# Patient Record
Sex: Female | Born: 1950 | Race: White | Hispanic: No | Smoking: Never smoker
Health system: Southern US, Community
[De-identification: ages and names within clinical notes are randomized; demographics above are authoritative.]

## PROBLEM LIST (undated history)

## (undated) ENCOUNTER — Ambulatory Visit: Admission: EM

## (undated) DIAGNOSIS — E785 Hyperlipidemia, unspecified: Secondary | ICD-10-CM

## (undated) DIAGNOSIS — I351 Nonrheumatic aortic (valve) insufficiency: Secondary | ICD-10-CM

## (undated) DIAGNOSIS — E032 Hypothyroidism due to medicaments and other exogenous substances: Secondary | ICD-10-CM

## (undated) DIAGNOSIS — C8510 Unspecified B-cell lymphoma, unspecified site: Secondary | ICD-10-CM

## (undated) DIAGNOSIS — T7840XA Allergy, unspecified, initial encounter: Secondary | ICD-10-CM

## (undated) DIAGNOSIS — C801 Malignant (primary) neoplasm, unspecified: Secondary | ICD-10-CM

## (undated) DIAGNOSIS — H269 Unspecified cataract: Secondary | ICD-10-CM

## (undated) DIAGNOSIS — E079 Disorder of thyroid, unspecified: Secondary | ICD-10-CM

## (undated) DIAGNOSIS — Q963 Mosaicism, 45, X/46, XX or XY: Secondary | ICD-10-CM

## (undated) DIAGNOSIS — E039 Hypothyroidism, unspecified: Secondary | ICD-10-CM

## (undated) DIAGNOSIS — M810 Age-related osteoporosis without current pathological fracture: Secondary | ICD-10-CM

## (undated) DIAGNOSIS — I071 Rheumatic tricuspid insufficiency: Secondary | ICD-10-CM

## (undated) DIAGNOSIS — H9190 Unspecified hearing loss, unspecified ear: Secondary | ICD-10-CM

## (undated) DIAGNOSIS — N390 Urinary tract infection, site not specified: Secondary | ICD-10-CM

## (undated) DIAGNOSIS — Z8589 Personal history of malignant neoplasm of other organs and systems: Secondary | ICD-10-CM

## (undated) HISTORY — DX: Unspecified B-cell lymphoma, unspecified site: C85.10

## (undated) HISTORY — DX: Age-related osteoporosis without current pathological fracture: M81.0

## (undated) HISTORY — PX: HERNIA REPAIR: SHX51

## (undated) HISTORY — PX: APPENDECTOMY: SHX54

## (undated) HISTORY — DX: Malignant (primary) neoplasm, unspecified: C80.1

## (undated) HISTORY — DX: Hyperlipidemia, unspecified: E78.5

## (undated) HISTORY — DX: Rheumatic tricuspid insufficiency: I07.1

## (undated) HISTORY — DX: Unspecified cataract: H26.9

## (undated) HISTORY — DX: Hypothyroidism due to medicaments and other exogenous substances: E03.2

## (undated) HISTORY — DX: Disorder of thyroid, unspecified: E07.9

## (undated) HISTORY — DX: Allergy, unspecified, initial encounter: T78.40XA

## (undated) HISTORY — PX: EYE SURGERY: SHX253

## (undated) HISTORY — DX: Hypothyroidism, unspecified: E03.9

## (undated) HISTORY — DX: Personal history of malignant neoplasm of other organs and systems: Z85.89

## (undated) HISTORY — DX: Mosaicism, 45, X/46, XX or XY: Q96.3

## (undated) HISTORY — DX: Unspecified hearing loss, unspecified ear: H91.90

## (undated) HISTORY — DX: Nonrheumatic aortic (valve) insufficiency: I35.1

## (undated) HISTORY — PX: ABDOMINAL HYSTERECTOMY: SHX81

---

## 2011-01-24 ENCOUNTER — Inpatient Hospital Stay (INDEPENDENT_AMBULATORY_CARE_PROVIDER_SITE_OTHER)
Admission: RE | Admit: 2011-01-24 | Discharge: 2011-01-24 | Disposition: A | Discharge status: Home / Self Care | Source: Ambulatory Visit | Attending: Family Medicine | Admitting: Family Medicine

## 2011-01-24 DIAGNOSIS — N390 Urinary tract infection, site not specified: Secondary | ICD-10-CM

## 2011-10-26 ENCOUNTER — Ambulatory Visit (INDEPENDENT_AMBULATORY_CARE_PROVIDER_SITE_OTHER): Admitting: Family Medicine

## 2011-10-26 VITALS — BP 108/66 | HR 60 | Temp 98.1°F | Resp 16 | Ht <= 58 in | Wt 101.0 lb

## 2011-10-26 DIAGNOSIS — N390 Urinary tract infection, site not specified: Secondary | ICD-10-CM

## 2011-10-26 DIAGNOSIS — R350 Frequency of micturition: Secondary | ICD-10-CM

## 2011-10-26 LAB — POCT URINALYSIS DIPSTICK
Bilirubin, UA: NEGATIVE
Blood, UA: NEGATIVE
Glucose, UA: NEGATIVE
Ketones, UA: NEGATIVE
Nitrite, UA: NEGATIVE
Protein, UA: NEGATIVE
Spec Grav, UA: 1.01
Urobilinogen, UA: 0.2
pH, UA: 7

## 2011-10-26 LAB — POCT UA - MICROSCOPIC ONLY
Casts, Ur, LPF, POC: NEGATIVE
Crystals, Ur, HPF, POC: NEGATIVE
Mucus, UA: NEGATIVE
Yeast, UA: NEGATIVE

## 2011-10-26 MED ORDER — CIPROFLOXACIN HCL 250 MG PO TABS: 250.0000 mg | ORAL_TABLET | Freq: Two times a day (BID) | ORAL | Status: AC

## 2011-10-26 NOTE — Patient Instructions (Signed)

## 2011-10-26 NOTE — Progress Notes (Signed)
S:  61 yo woman with 3 weeks of dysuria and frequency.  The symptoms are mild  No fever, back pain, ;hematuria  O:  Alert, NAD No cvat  Results for orders placed in visit on 10/26/11  POCT URINALYSIS DIPSTICK      Component Value Range   Color, UA yellow     Clarity, UA clear     Glucose, UA neg     Bilirubin, UA neg     Ketones, UA neg     Spec Grav, UA 1.010     Blood, UA neg     pH, UA 7.0     Protein, UA neg     Urobilinogen, UA 0.2     Nitrite, UA neg     Leukocytes, UA small (1+)     A: uncomplicated UTI  P:   1. Urinary frequency  POCT urinalysis dipstick, POCT UA - Microscopic Only, Urine culture, ciprofloxacin (CIPRO) 250 MG tablet

## 2011-10-28 LAB — URINE CULTURE
Colony Count: NO GROWTH
Organism ID, Bacteria: NO GROWTH

## 2013-10-07 ENCOUNTER — Ambulatory Visit (INDEPENDENT_AMBULATORY_CARE_PROVIDER_SITE_OTHER): Admitting: Physician Assistant

## 2013-10-07 VITALS — BP 98/66 | HR 79 | Temp 98.8°F | Resp 16 | Ht 59.0 in | Wt 107.6 lb

## 2013-10-07 DIAGNOSIS — J069 Acute upper respiratory infection, unspecified: Secondary | ICD-10-CM

## 2013-10-07 DIAGNOSIS — J329 Chronic sinusitis, unspecified: Secondary | ICD-10-CM

## 2013-10-07 MED ORDER — AMOXICILLIN-POT CLAVULANATE 875-125 MG PO TABS: 1.0000 | ORAL_TABLET | Freq: Two times a day (BID) | ORAL | Status: DC

## 2013-10-07 MED ORDER — BENZONATATE 100 MG PO CAPS: 100.0000 mg | ORAL_CAPSULE | Freq: Three times a day (TID) | ORAL | Status: DC | PRN

## 2013-10-07 MED ORDER — PROMETHAZINE-DM 6.25-15 MG/5ML PO SYRP: 5.0000 mL | ORAL_SOLUTION | Freq: Four times a day (QID) | ORAL | Status: DC | PRN

## 2013-10-07 NOTE — Progress Notes (Signed)
   Subjective:    Patient ID: Tabitha Baker, female    DOB: Jul 25, 1950, 63 y.o.   MRN: [id number]  HPI   Tabitha Baker is a very pleasant 63 yr old female here with concern for illness.  Reports 4 days of symptoms.  Started with scratchy throat, then sinus HA.  Now with dry, hacking cough.  Feels "puffy" in her sinuses.  She has ome RIGHT ear fullness.  She denies SOB or wheezing.  Subjective fever but none measured.  She is using Nasacort BID, Mucinex and is doing sinus flushes.  She reports a significant ENT history - ENT is in [place] where she lives part time.  She reports she nearly had a tympanostomy due to mid ear effusion.  She had similar symptoms in Feb 2015 - thinks was treated with avelox.  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   Review of Systems  Constitutional: Positive for fever (subjective). Negative for chills.  HENT: Positive for congestion, ear pain, sinus pressure and sore throat.   Respiratory: Positive for cough. Negative for shortness of breath and wheezing.   Cardiovascular: Negative.   Gastrointestinal: Negative.   Musculoskeletal: Positive for myalgias.  Skin: Negative.        Objective:   Physical Exam  Vitals reviewed. Constitutional: She is oriented to person, place, and time. She appears well-developed and well-nourished. No distress.  HENT:  Head: Normocephalic and atraumatic.  Right Ear: Tympanic membrane and ear canal normal.  Left Ear: Tympanic membrane and ear canal normal.  Nose: Right sinus exhibits maxillary sinus tenderness and frontal sinus tenderness. Left sinus exhibits maxillary sinus tenderness and frontal sinus tenderness.  Mouth/Throat: Uvula is midline, oropharynx is clear and moist and mucous membranes are normal.  Eyes: Conjunctivae are normal. No scleral icterus.  Neck: Neck supple.  Cardiovascular: Normal rate, regular rhythm and normal heart sounds.   Pulmonary/Chest: Effort normal and breath sounds normal.  She has no wheezes. She has no rales.  Lymphadenopathy:    She has no cervical adenopathy.  Neurological: She is alert and oriented to person, place, and time.  Skin: Skin is warm and dry.  Psychiatric: She has a normal mood and affect. Her behavior is normal.       Assessment & Plan:  Acute upper respiratory infections of unspecified site - Plan: benzonatate (TESSALON) 100 MG capsule, amoxicillin-clavulanate (AUGMENTIN) 875-125 MG per tablet, promethazine-dextromethorphan (PROMETHAZINE-DM) 6.25-15 MG/5ML syrup  Sinusitis - Plan: amoxicillin-clavulanate (AUGMENTIN) 875-125 MG per tablet   Tabitha Baker is a very pleasant 63 yr old female with 4 days of URI symptoms.  Discussed with pt that is is likely to be viral, but she is very concerned for bacterial infection given her ENT history and her history of lymphoma.  Therefore will treat with augmentin x 10 days.  Continue Nasacort, Mucinex, nasal saline lavage.  Add Tessalon and Phenergan DM for cough.  Push fluids.    Pt to call or RTC if worsening or not improving  E. Natividad Brood MHS, PA-C [facility] 6/8/20157:49 PM

## 2013-10-07 NOTE — Patient Instructions (Signed)
The antibiotic prescribed today is for your present infection only. It is very important to follow the directions for the medication prescribed. Antibiotics are generally given for a specified period of time (7-10 days, for example) to be taken at specific intervals (every 4, 6, 8 or 12 hours). This is necessary to keep the right amount of the medication in the bloodstream. Too much of the medication may cause an adverse reaction, too little may not be completely effective.  To clear your infection completely, continue taking the antibiotic for the full time of treatment, even if you begin to feel better after a few days.  If you miss a dose of the antibiotic, take it as soon as possible. Then go back to your regular dosing schedule. However, don't double up doses.    Begin taking the Augmentin as directed.  Be sure to finish the full course even when you begin to feel better.  Take with food to reduce stomach upset  Continue using Nasacort and Mucinex. Continue sinus flushes  Make sure you are drinking plenty of fluids (water is best!)  Use the benzonatate (Tessalon Perles) every 8 hours as needed for cough  Use the Phenergan DM cough syrup if needed at night - will likely make you sleepy  Please let us know if any symptoms are worsening or not improving

## 2014-02-03 ENCOUNTER — Ambulatory Visit (INDEPENDENT_AMBULATORY_CARE_PROVIDER_SITE_OTHER): Admitting: Family Medicine

## 2014-02-03 VITALS — BP 110/70 | HR 65 | Temp 98.6°F | Resp 16 | Ht 59.0 in | Wt 109.0 lb

## 2014-02-03 DIAGNOSIS — H109 Unspecified conjunctivitis: Secondary | ICD-10-CM

## 2014-02-03 MED ORDER — MOXIFLOXACIN HCL (2X DAY) 0.5 % OP SOLN: 1.0000 [drp] | Freq: Two times a day (BID) | OPHTHALMIC | Status: DC

## 2014-02-03 NOTE — Patient Instructions (Signed)
You do appear to have pinkeye (conjunctivitis).  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  Use the moxeza drops as directed and let me know if you do not feel better in the next 2 days- Sooner if worse.

## 2014-02-03 NOTE — Progress Notes (Signed)
[facility] [address], [place] [address] [phone]  Date:  02/03/2014   Name:  Tabitha Baker   DOB:  October 14, 1950   MRN:  [id number]  PCP:  No primary provider on file.    Chief Complaint: Eye Drainage   History of Present Illness:  Tabitha Baker is a 63 y.o. very pleasant female patient who presents with the following:  Here today with a concern about her left eye.  She noted a problem since yesterday.  She has noted green drainage.  The eye is scratchy and uncomfortable to blink.  She does notice some sensitivity to light.  Her next eye appt is later on this month- her eye doctor is in [place].  She wears glasses for near vision, no contact lenses used  She had cataract surgery in March, and has plugs in her tearducts bilaterally for chronic dry eyes.  She is on restasis, warm compresses and gelteal dry eye gel.   She otherwise feels well and is not aware of any exposure to conjunctivitis.  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    There are no active problems to display for this patient.   Past Medical History  Diagnosis Date  . Allergy   . Cancer   . Cataract   . Osteoporosis   . Thyroid disease     Past Surgical History  Procedure Laterality Date  . Appendectomy    . Eye surgery    . Hernia repair    . Abdominal hysterectomy      History  Substance Use Topics  . Smoking status: Never Smoker   . Smokeless tobacco: Not on file  . Alcohol Use: Not on file    Family History  Problem Relation Age of Onset  . Cancer Mother   . Hyperlipidemia Mother   . Hypertension Mother   . Heart disease Father   . Stroke Father   . Cancer Brother   . Heart disease Brother   . Hypertension Maternal Grandmother   . Stroke Maternal Grandmother   . Cancer Maternal Grandfather   . Heart disease Maternal Grandfather   . Stroke Paternal Grandmother   . Cancer Paternal Grandfather      Allergies  Allergen Reactions  . Sulfa Antibiotics Hives    Medication list has been reviewed and updated.  Current Outpatient Prescriptions on File Prior to Visit  Medication Sig Dispense Refill  . co-enzyme Q-10 30 MG capsule Take 30 mg by mouth 3 (three) times daily.      Marland Kitchen conjugated estrogens (PREMARIN) vaginal cream Place vaginally daily.      Marland Kitchen CRANBERRY EXTRACT PO Take by mouth daily.      . cycloSPORINE (RESTASIS) 0.05 % ophthalmic emulsion Place 1 drop into both eyes 2 (two) times daily.      Marland Kitchen ezetimibe (ZETIA) 10 MG tablet Take 10 mg by mouth daily.      . fish oil-omega-3 fatty acids 1000 MG capsule Take 2 g by mouth daily.      Marland Kitchen levothyroxine (SYNTHROID, LEVOTHROID) 88 MCG tablet Take 88 mcg by mouth daily.      . Red Yeast Rice 600 MG CAPS Take by mouth 4 (four) times daily.      . Risedronate Sodium (ATELVIA) 35 MG TBEC Take 35 mg by mouth once a week.       No current facility-administered medications on file prior to visit.  Review of Systems:  As per HPI- otherwise negative.   Physical Examination: Filed Vitals:   02/03/14 1607  BP: 110/70  Pulse: 65  Temp: 98.6 F (37 C)  Resp: 16   Filed Vitals:   02/03/14 1607  Height: 4\' 11"  (1.499 m)  Weight: 109 lb (49.442 kg)   Body mass index is 22 kg/(m^2). Ideal Body Weight: Weight in (lb) to have BMI = 25: 123.5  GEN: WDWN, NAD, Non-toxic, A & O x 3 HEENT: Atraumatic, Normocephalic. Neck supple. No masses, No LAD.  Bilateral TM wnl, oropharynx normal.  PEERL,EOMI.   Left eye with yellow colored sticky discharge in corners of eye and on lashes b Ears and Nose: No external deformity. CV: RRR, No M/G/R. No JVD. No thrill. No extra heart sounds. PULM: CTA B, no wheezes, crackles, rhonchi. No retractions. No resp. distress. No accessory muscle use. EXTR: No c/c/e NEURO Normal gait.  PSYCH: Normally interactive. Conversant. Not depressed or anxious appearing.  Calm demeanor.  Negative fluorescin  left eye.  She had relief of her sx by proparacaine drops.    Assessment and Plan: Conjunctivitis of left eye - Plan: Moxifloxacin HCl 0.5 % SOLN  Treat for conjunctivitis with moxeza drops, and she will let me know if not better in the next 1-2 days.  Given coupon for moxeza   Signed Lamar Blinks, MD

## 2015-02-11 ENCOUNTER — Ambulatory Visit (INDEPENDENT_AMBULATORY_CARE_PROVIDER_SITE_OTHER): Admitting: Family Medicine

## 2015-02-11 VITALS — BP 110/76 | HR 74 | Temp 98.4°F | Resp 16 | Ht <= 58 in | Wt 111.0 lb

## 2015-02-11 DIAGNOSIS — N3090 Cystitis, unspecified without hematuria: Secondary | ICD-10-CM | POA: Diagnosis not present

## 2015-02-11 DIAGNOSIS — N390 Urinary tract infection, site not specified: Secondary | ICD-10-CM

## 2015-02-11 LAB — POC MICROSCOPIC URINALYSIS (UMFC)

## 2015-02-11 LAB — POCT URINALYSIS DIP (MANUAL ENTRY)
BILIRUBIN UA: NEGATIVE
GLUCOSE UA: NEGATIVE
Ketones, POC UA: NEGATIVE
NITRITE UA: NEGATIVE
PH UA: 6.5
Protein Ur, POC: NEGATIVE
Spec Grav, UA: <=1.005
Urobilinogen, UA: 0.2

## 2015-02-11 MED ORDER — PHENAZOPYRIDINE HCL 200 MG PO TABS: 200.0000 mg | ORAL_TABLET | Freq: Three times a day (TID) | ORAL | Status: DC | PRN

## 2015-02-11 MED ORDER — NITROFURANTOIN MONOHYD MACRO 100 MG PO CAPS: 100.0000 mg | ORAL_CAPSULE | Freq: Two times a day (BID) | ORAL | Status: DC

## 2015-02-11 NOTE — Patient Instructions (Signed)
Try the pyridium for a day or two. If your symptoms get better on it but worse off of it, then you probably do have a UTI and should start on the antibiotic. We will know for sure by this weekend if you need an antibiotic but if your symptoms are getting worse, it is safeest to start it rather than let an infection progress to your kidneys.  Urinary Tract Infection Urinary tract infections (UTIs) can develop anywhere along your urinary tract. Your urinary tract is your body's drainage system for removing wastes and extra water. Your urinary tract includes two kidneys, two ureters, a bladder, and a urethra. Your kidneys are a pair of bean-shaped organs. Each kidney is about the size of your fist. They are located below your ribs, one on each side of your spine. CAUSES Infections are caused by microbes, which are microscopic organisms, including fungi, viruses, and bacteria. These organisms are so small that they can only be seen through a microscope. Bacteria are the microbes that most commonly cause UTIs. SYMPTOMS  Symptoms of UTIs may vary by age and gender of the patient and by the location of the infection. Symptoms in young women typically include a frequent and intense urge to urinate and a painful, burning feeling in the bladder or urethra during urination. Older women and men are more likely to be tired, shaky, and weak and have muscle aches and abdominal pain. A fever may mean the infection is in your kidneys. Other symptoms of a kidney infection include pain in your back or sides below the ribs, nausea, and vomiting. DIAGNOSIS To diagnose a UTI, your caregiver will ask you about your symptoms. Your caregiver will also ask you to provide a urine sample. The urine sample will be tested for bacteria and white blood cells. White blood cells are made by your body to help fight infection. TREATMENT  Typically, UTIs can be treated with medication. Because most UTIs are caused by a bacterial infection,  they usually can be treated with the use of antibiotics. The choice of antibiotic and length of treatment depend on your symptoms and the type of bacteria causing your infection. HOME CARE INSTRUCTIONS  If you were prescribed antibiotics, take them exactly as your caregiver instructs you. Finish the medication even if you feel better after you have only taken some of the medication.  Drink enough water and fluids to keep your urine clear or pale yellow.  Avoid caffeine, tea, and carbonated beverages. They tend to irritate your bladder.  Empty your bladder often. Avoid holding urine for long periods of time.  Empty your bladder before and after sexual intercourse.  After a bowel movement, women should cleanse from front to back. Use each tissue only once. SEEK MEDICAL CARE IF:   You have back pain.  You develop a fever.  Your symptoms do not begin to resolve within 3 days. SEEK IMMEDIATE MEDICAL CARE IF:   You have severe back pain or lower abdominal pain.  You develop chills.  You have nausea or vomiting.  You have continued burning or discomfort with urination. MAKE SURE YOU:   Understand these instructions.  Will watch your condition.  Will get help right away if you are not doing well or get worse.   This information is not intended to replace advice given to you by your health care provider. Make sure you discuss any questions you have with your health care provider.   Document Released: 01/26/2005 Document Revised: 01/07/2015 Document Reviewed:  05/27/2011 Elsevier Interactive Patient Education 2016 Elsevier Inc. Interstitial Cystitis Interstitial cystitis is a condition that causes inflammation of the bladder. The bladder is a hollow organ in the lower part of your abdomen. It stores urine after the urine is made by your kidneys. With interstitial cystitis, you may have pain in the bladder area. You may also have a frequent and urgent need to urinate. The severity of  interstitial cystitis can vary from person to person. You may have flare-ups of the condition, and then it may go away for a while. For many people who have this condition, it becomes a long-term problem. CAUSES The cause of this condition is not known. RISK FACTORS This condition is more likely to develop in women. SYMPTOMS Symptoms of interstitial cystitis vary, and they can change over time. Symptoms may include:  Discomfort or pain in the bladder area. This can range from mild to severe. The pain may change in intensity as the bladder fills with urine or as it empties.  Pelvic pain.  An urgent need to urinate.  Frequent urination.  Pain during sexual intercourse.  Pinpoint bleeding on the bladder wall. For women, the symptoms often get worse during menstruation. DIAGNOSIS This condition is diagnosed by evaluating your symptoms and ruling out other causes. A physical exam will be done. Various tests may be done to rule out other conditions. Common tests include:  Urine tests.  Cystoscopy. In this test, a tool that is like a very thin telescope is used to look into your bladder.  Biopsy. This involves taking a sample of tissue from the bladder wall to be examined under a microscope. TREATMENT There is no cure for interstitial cystitis, but treatment methods are available to control your symptoms. Work closely with your health care provider to find the treatments that will be most effective for you. Treatment options may include:  Medicines to relieve pain and to help reduce the number of times that you feel the need to urinate.  Bladder training. This involves learning ways to control when you urinate, such as:  Urinating at scheduled times.  Training yourself to delay urination.  Doing exercises (Kegel exercises) to strengthen the muscles that control urine flow.  Lifestyle changes, such as changing your diet or taking steps to control stress.  Use of a device that  provides electrical stimulation in order to reduce pain.  A procedure that stretches your bladder by filling it with air or fluid.  Surgery. This is rare. It is only done for extreme cases if other treatments do not help. HOME CARE INSTRUCTIONS  Take medicines only as directed by your health care provider.  Use bladder training techniques as directed.  Keep a bladder diary to find out which foods, liquids, or activities make your symptoms worse.  Use your bladder diary to schedule bathroom trips. If you are away from home, plan to be near a bathroom at each of your scheduled times.  Make sure you urinate just before you leave the house and just before you go to bed.  Do Kegel exercises as directed by your health care provider.  Do not drink alcohol.  Do not use any tobacco products, including cigarettes, chewing tobacco, or electronic cigarettes. If you need help quitting, ask your health care provider.  Make dietary changes as directed by your health care provider. You may need to avoid spicy foods and foods that contain a high amount of potassium.  Limit your drinking of beverages that stimulate urination. These  include soda, coffee, and tea.  Keep all follow-up visits as directed by your health care provider. This is important. SEEK MEDICAL CARE IF:  Your symptoms do not get better after treatment.  Your pain and discomfort are getting worse.  You have more frequent urges to urinate.  You have a fever. SEEK IMMEDIATE MEDICAL CARE IF:  You are not able to control your bladder at all.   This information is not intended to replace advice given to you by your health care provider. Make sure you discuss any questions you have with your health care provider.   Document Released: 12/18/2003 Document Revised: 05/09/2014 Document Reviewed: 12/24/2013 Elsevier Interactive Patient Education [employer].

## 2015-02-11 NOTE — Progress Notes (Addendum)
Subjective:  This chart was scribed for Delman Cheadle, MD by Moises Blood, Medical Scribe. This patient was seen in Room 1 and the patient's care was started 2:12 PM.    Patient ID: Tabitha Baker, female    DOB: 09/30/1950, 64 y.o.   MRN: [id number] Chief Complaint  Patient presents with  . loose stool    x  2 days   . urine frequently    HPI Tabitha Baker is a 64 y.o. female who presents to [facility] complaining of urinary frequency for last couple of days. She's had UTI's in the past, starting in 2009. She would get up multiple times at night to urinate. She denies nausea, vomiting, fever, chills. She notes some soreness over her SI joint.   Her stool is more frequent, loose and soft and has been going on since the urinary frequency was noticed. She denies abd cramping, blood in stool. She denies taking antibiotics in the past month.   She's had lymphoma cancer in the past.   Past Medical History  Diagnosis Date  . Allergy   . Cancer ([place])   . Cataract   . Osteoporosis   . Thyroid disease    Prior to Admission medications   Medication Sig Start Date End Date Taking? Authorizing Provider  co-enzyme Q-10 30 MG capsule Take 30 mg by mouth 3 (three) times daily.   Yes Historical Provider, MD  conjugated estrogens (PREMARIN) vaginal cream Place vaginally daily.   Yes Historical Provider, MD  CRANBERRY EXTRACT PO Take by mouth daily.   Yes Historical Provider, MD  cycloSPORINE (RESTASIS) 0.05 % ophthalmic emulsion Place 1 drop into both eyes 2 (two) times daily.   Yes Historical Provider, MD  ezetimibe (ZETIA) 10 MG tablet Take 10 mg by mouth daily.   Yes Historical Provider, MD  fish oil-omega-3 fatty acids 1000 MG capsule Take 2 g by mouth daily.   Yes Historical Provider, MD  levothyroxine (SYNTHROID, LEVOTHROID) 88 MCG tablet Take 75 mcg by mouth daily.    Yes Historical Provider, MD  Red Yeast Rice 600 MG CAPS Take by mouth 4 (four) times daily.   Yes Historical Provider, MD    Allergies  Allergen Reactions  . Sulfa Antibiotics Hives    Review of Systems  Constitutional: Negative for fever, chills, activity change, appetite change and unexpected weight change.  Cardiovascular: Negative for chest pain.  Gastrointestinal: Positive for diarrhea. Negative for nausea, vomiting, abdominal pain, constipation, blood in stool, anal bleeding and rectal pain.  Endocrine: Positive for polyuria.  Genitourinary: Positive for urgency, frequency and pelvic pain. Negative for dysuria, hematuria, flank pain, decreased urine volume, vaginal bleeding, vaginal discharge, enuresis, difficulty urinating and genital sores.  Musculoskeletal: Positive for back pain and arthralgias.  Skin: Negative for rash and wound.  Hematological: Negative for adenopathy.       Objective:   Physical Exam  Constitutional: She is oriented to person, place, and time. She appears well-developed and well-nourished. No distress.  HENT:  Head: Normocephalic and atraumatic.  Eyes: EOM are normal. Pupils are equal, round, and reactive to light.  Neck: Neck supple.  Cardiovascular: Normal rate, regular rhythm and normal heart sounds.   Pulmonary/Chest: Effort normal and breath sounds normal. No respiratory distress.  Abdominal: Soft. Bowel sounds are normal. She exhibits no distension. There is no hepatosplenomegaly. There is tenderness (over suprapubic region). There is no rebound, no guarding and no CVA tenderness.  Musculoskeletal: Normal range of motion.  Neurological: She is alert and  oriented to person, place, and time.  Skin: Skin is warm and dry.  Psychiatric: She has a normal mood and affect. Her behavior is normal.  Nursing note and vitals reviewed.  BP 110/76 mmHg  Pulse 74  Temp(Src) 98.4 F (36.9 C) (Oral)  Resp 16  Ht 4\' 10"  (1.473 m)  Wt 111 lb (50.349 kg)  BMI 23.21 kg/m2  SpO2 96%  Results for orders placed or performed in visit on 02/11/15  Urine culture  Result Value Ref  Range   Colony Count 20,OOO COLONIES/ML    Organism ID, Bacteria Multiple bacterial morphotypes present, none    Organism ID, Bacteria predominant. Suggest appropriate recollection if     Organism ID, Bacteria clinically indicated.   POCT urinalysis dipstick  Result Value Ref Range   Color, UA light yellow (A) yellow   Clarity, UA clear clear   Glucose, UA negative negative   Bilirubin, UA negative negative   Ketones, POC UA negative negative   Spec Grav, UA <=1.005    Blood, UA small (A) negative   pH, UA 6.5    Protein Ur, POC negative negative   Urobilinogen, UA 0.2    Nitrite, UA Negative Negative   Leukocytes, UA large (3+) (A) Negative  POCT Microscopic Urinalysis ([facility])  Result Value Ref Range   WBC,UR,HPF,POC Many (A) None WBC/hpf   RBC,UR,HPF,POC Few (A) None RBC/hpf   Bacteria None None   Mucus Present (A) Absent   Epithelial Cells, UR Per Microscopy Few (A) None cells/hpf        Assessment & Plan:   1. Recurrent UTI   2. Cystitis     Orders Placed This Encounter  Procedures  . Urine culture  . POCT urinalysis dipstick  . POCT Microscopic Urinalysis ([facility])    Meds ordered this encounter  Medications  . nitrofurantoin, macrocrystal-monohydrate, (MACROBID) 100 MG capsule    Sig: Take 1 capsule (100 mg total) by mouth 2 (two) times daily.    Dispense:  14 capsule    Refill:  0  . phenazopyridine (PYRIDIUM) 200 MG tablet    Sig: Take 1 tablet (200 mg total) by mouth 3 (three) times daily as needed for pain (urinary frequency).    Dispense:  12 tablet    Refill:  0    I personally performed the services described in this documentation, which was scribed in my presence. The recorded information has been reviewed and considered, and addended by me as needed.  Delman Cheadle, MD MPH  By signing my name below, I, Moises Blood, attest that this documentation has been prepared under the direction and in the presence of Delman Cheadle, MD. Electronically Signed:  Moises Blood, [place]. 02/11/2015 , 2:19 PM .

## 2015-02-12 LAB — URINE CULTURE

## 2015-09-18 ENCOUNTER — Ambulatory Visit (INDEPENDENT_AMBULATORY_CARE_PROVIDER_SITE_OTHER): Admitting: Physician Assistant

## 2015-09-18 ENCOUNTER — Telehealth: Payer: Self-pay

## 2015-09-18 VITALS — BP 116/78 | HR 65 | Temp 98.7°F | Resp 16

## 2015-09-18 DIAGNOSIS — S70361A Insect bite (nonvenomous), right thigh, initial encounter: Secondary | ICD-10-CM | POA: Diagnosis not present

## 2015-09-18 DIAGNOSIS — W57XXXA Bitten or stung by nonvenomous insect and other nonvenomous arthropods, initial encounter: Secondary | ICD-10-CM | POA: Diagnosis not present

## 2015-09-18 DIAGNOSIS — S30861A Insect bite (nonvenomous) of abdominal wall, initial encounter: Secondary | ICD-10-CM

## 2015-09-18 MED ORDER — DOXYCYCLINE MONOHYDRATE 100 MG PO TABS: 200.0000 mg | ORAL_TABLET | Freq: Once | ORAL | Status: DC

## 2015-09-18 MED ORDER — DOXYCYCLINE HYCLATE 50 MG PO CAPS: 200.0000 mg | ORAL_CAPSULE | Freq: Once | ORAL | Status: DC

## 2015-09-18 NOTE — Progress Notes (Signed)
   Subjective:    Patient ID: Tabitha Baker, female    DOB: 1951/01/01, 65 y.o.   MRN: [id number]  Chief Complaint  Patient presents with  . Tick Bite    right leg upper leg, yesterday   Medications, allergies, past medical history, surgical history, family history, social history and problem list reviewed and updated.  HPI  41 yof with above complaint.   Out for walk 5/18 evening. Came in and approx one hour later felt sting upper inner right thigh. Saw a tick. Carefully removed by friend within 30 mins.   Very concerned about RMSF as she has a friend who got it.   Denies fevers, chills, abd pain, n/v, HA, rash.   Review of Systems See HPI     Objective:   Physical Exam  Constitutional: She appears well-developed and well-nourished.  Non-toxic appearance. She does not have a sickly appearance. She does not appear ill. No distress.  BP 116/78 mmHg  Pulse 65  Temp(Src) 98.7 F (37.1 C)  Resp 16  SpO2 99%   Cardiovascular: Normal rate, regular rhythm and normal heart sounds.   Pulmonary/Chest: Effort normal and breath sounds normal.  Skin:  Small erythematous papule right upper inner thigh. No erythema migrans or diffuse maculopapular rash.       Assessment & Plan:   Tick bite of groin, initial encounter - Plan: doxycycline (VIBRAMYCIN) 50 MG capsule, doxycycline (ADOXA) 100 MG tablet --very low likelihood of borrelia or rickettsia transmission as tick removed within 2 hours of attaching --pt very concerned about potential for RMSF transmission, discussed pros and cons of one time prophylactic doxy dose --script given for one time dose, pt to think over it today - instructed to take within 24 hours if she chooses to --instructed to rtc with fevers, chills, n/v, fatigue, ha, abd pain, rash  Julieta Gutting, PA-C Physician Assistant-Certified [facility]  09/19/2015 12:09 PM

## 2015-09-18 NOTE — Patient Instructions (Addendum)
It is a VERY low likelihood that the tick transmitted either the bacteria that cause Lyme or [facility] Spotted Fever.  If you would like, you can take the doxycycline one time to prevent any possible transmission, though again transmission is very unlikely due to the short amount of time that the tick was attached.  Be sure to be seen if you start to develop fevers, chills, rash, nausea, vomiting, headache, or abdominal pain.   Tick Bite Information Ticks are insects that attach themselves to the skin and draw blood for food. There are various types of ticks. Common types include wood ticks and deer ticks. Most ticks live in shrubs and grassy areas. Ticks can climb onto your body when you make contact with leaves or grass where the tick is waiting. The most common places on the body for ticks to attach themselves are the scalp, neck, armpits, waist, and groin. Most tick bites are harmless, but sometimes ticks carry germs that cause diseases. These germs can be spread to a person during the tick's feeding process. The chance of a disease spreading through a tick bite depends on:   The type of tick.  Time of year.   How long the tick is attached.   Geographic location.  HOW CAN YOU PREVENT TICK BITES? Take these steps to help prevent tick bites when you are outdoors:  Wear protective clothing. Long sleeves and long pants are best.   Wear white clothes so you can see ticks more easily.  Tuck your pant legs into your socks.   If walking on a trail, stay in the middle of the trail to avoid brushing against bushes.  Avoid walking through areas with long grass.  Put insect repellent on all exposed skin and along boot tops, pant legs, and sleeve cuffs.   Check clothing, hair, and skin repeatedly and before going inside.   Brush off any ticks that are not attached.  Take a shower or bath as soon as possible after being outdoors.  WHAT IS THE PROPER WAY TO REMOVE A  TICK? Ticks should be removed as soon as possible to help prevent diseases caused by tick bites. 1. If latex gloves are available, put them on before trying to remove a tick.  2. Using fine-point tweezers, grasp the tick as close to the skin as possible. You may also use curved forceps or a tick removal tool. Grasp the tick as close to its head as possible. Avoid grasping the tick on its body. 3. Pull gently with steady upward pressure until the tick lets go. Do not twist the tick or jerk it suddenly. This may break off the tick's head or mouth parts. 4. Do not squeeze or crush the tick's body. This could force disease-carrying fluids from the tick into your body.  5. After the tick is removed, wash the bite area and your hands with soap and water or other disinfectant such as alcohol. 6. Apply a small amount of antiseptic cream or ointment to the bite site.  7. Wash and disinfect any instruments that were used.  Do not try to remove a tick by applying a hot match, petroleum jelly, or fingernail polish to the tick. These methods do not work and may increase the chances of disease being spread from the tick bite.  WHEN SHOULD YOU SEEK MEDICAL CARE? Contact your health care provider if you are unable to remove a tick from your skin or if a part of the tick breaks off  and is stuck in the skin.  After a tick bite, you need to be aware of signs and symptoms that could be related to diseases spread by ticks. Contact your health care provider if you develop any of the following in the days or weeks after the tick bite:  Unexplained fever.  Rash. A circular rash that appears days or weeks after the tick bite may indicate the possibility of Lyme disease. The rash may resemble a target with a bull's-eye and may occur at a different part of your body than the tick bite.  Redness and swelling in the area of the tick bite.   Tender, swollen lymph glands.   Diarrhea.   Weight loss.   Cough.    Fatigue.   Muscle, joint, or bone pain.   Abdominal pain.   Headache.   Lethargy or a change in your level of consciousness.  Difficulty walking or moving your legs.   Numbness in the legs.   Paralysis.  Shortness of breath.   Confusion.   Repeated vomiting.    This information is not intended to replace advice given to you by your health care provider. Make sure you discuss any questions you have with your health care provider.   Document Released: 04/15/2000 Document Revised: 05/09/2014 Document Reviewed: 09/26/2012 Elsevier Interactive Patient Education [employer].

## 2015-09-18 NOTE — Telephone Encounter (Signed)
Patient is request her notes for today's visit, diagnoses coDe, and cpt code to be faxed to her provider in [place]. Patient has filled out a release for the office note to be faxed and was informed to call back next week to see if an itemized bill is ready.  Dr. Dara Hoyer  [facility] in [place] fax: [phone] Office phone: [phone]

## 2015-09-29 NOTE — Telephone Encounter (Signed)
Itemized bill was sent to Patient home address.

## 2017-01-18 ENCOUNTER — Ambulatory Visit (INDEPENDENT_AMBULATORY_CARE_PROVIDER_SITE_OTHER): Admitting: Family Medicine

## 2017-01-18 ENCOUNTER — Encounter: Payer: Self-pay | Admitting: Family Medicine

## 2017-01-18 VITALS — BP 137/75 | HR 82 | Temp 98.0°F | Resp 16 | Ht 58.27 in | Wt 110.0 lb

## 2017-01-18 DIAGNOSIS — L237 Allergic contact dermatitis due to plants, except food: Secondary | ICD-10-CM

## 2017-01-18 MED ORDER — PREDNISONE 20 MG PO TABS: ORAL_TABLET | ORAL | 0 refills | Status: DC

## 2017-01-18 NOTE — Progress Notes (Signed)
Subjective:    Patient ID: Tabitha Baker, female    DOB: 1951-03-27, 66 y.o.   MRN: [id number]  01/18/2017  Skin Lesions (x 2 weeks forming all over body that is itchy and red)   HPI This 66 y.o. female presents for evaluation of pruritic itch.  Started with single lesion on L hand; itched and hurt horribly.  Then, exploaded; starts in vesicle and itches like crazy and then oozes yellow stuff.   Over the weekend, got worse and worse.  Cannot find fleas or bed  Bugs.  Mosquitos would not have gotten this bad.  On homemade soap but once and a while; then using Dove. One ear that is hot very; had one bite in curve; ear that uses for teleopone.  Very inflamed.    Chest to breast fine; arms fine.  Everything else not fine.    Zyrtec D and Fluticasone.     BP Readings from Last 3 Encounters:  01/18/17 137/75  09/18/15 116/78  02/11/15 110/76   Wt Readings from Last 3 Encounters:  01/18/17 110 lb (49.9 kg)  02/11/15 111 lb (50.3 kg)  02/03/14 109 lb (49.4 kg)    There is no immunization history on file for this patient.  Review of Systems  Constitutional: Negative for chills, diaphoresis, fatigue and fever.  HENT: Negative for ear pain, mouth sores, postnasal drip, rhinorrhea, sinus pressure, sore throat and trouble swallowing.   Respiratory: Negative for cough and shortness of breath.   Cardiovascular: Negative for chest pain, palpitations and leg swelling.  Gastrointestinal: Negative for abdominal pain, constipation, diarrhea, nausea and vomiting.  Genitourinary: Negative for genital sores and vaginal bleeding.  Skin: Positive for rash.    Past Medical History:  Diagnosis Date  . Allergy   . Cancer ([place])   . Cataract   . Osteoporosis   . Thyroid disease    Past Surgical History:  Procedure Laterality Date  . ABDOMINAL HYSTERECTOMY    . APPENDECTOMY    . EYE SURGERY    . HERNIA REPAIR     Allergies  Allergen Reactions  . Sulfa Antibiotics Hives    Social History     Social History  . Marital status: Widowed    Spouse name: N/A  . Number of children: N/A  . Years of education: N/A   Occupational History  . Not on file.   Social History Main Topics  . Smoking status: Never Smoker  . Smokeless tobacco: Never Used  . Alcohol use No  . Drug use: No  . Sexual activity: Not on file   Other Topics Concern  . Not on file   Social History Narrative  . No narrative on file   Family History  Problem Relation Age of Onset  . Cancer Mother   . Hyperlipidemia Mother   . Hypertension Mother   . Heart disease Father   . Stroke Father   . Cancer Brother   . Heart disease Brother   . Hypertension Maternal Grandmother   . Stroke Maternal Grandmother   . Cancer Maternal Grandfather   . Heart disease Maternal Grandfather   . Stroke Paternal Grandmother   . Cancer Paternal Grandfather        Objective:    BP 137/75   Pulse 82   Temp 98 F (36.7 C) (Oral)   Resp 16   Ht 4' 10.27" (1.48 m)   Wt 110 lb (49.9 kg)   SpO2 96%   BMI 22.78 kg/m  Physical  Exam  Constitutional: She is oriented to person, place, and time. She appears well-developed and well-nourished. No distress.  HENT:  Head: Normocephalic and atraumatic.  Eyes: Pupils are equal, round, and reactive to light. Conjunctivae are normal.  Neck: Normal range of motion. Neck supple.  Cardiovascular: Normal rate, regular rhythm and normal heart sounds.  Exam reveals no gallop and no friction rub.   No murmur heard. Pulmonary/Chest: Effort normal and breath sounds normal. She has no wheezes. She has no rales.  Neurological: She is alert and oriented to person, place, and time.  Skin: Rash noted. Rash is maculopapular and vesicular. She is not diaphoretic.  Scattered erythematous healing vesicular rash lower back and arms and distal legs.  Linear distribution to some lesions along legs.  Psychiatric: She has a normal mood and affect. Her behavior is normal.  Nursing note and vitals  reviewed.   No results found. Depression screen [facility] 2/9 01/18/2017 09/18/2015 02/11/2015  Decreased Interest 0 0 0  Down, Depressed, Hopeless 0 0 0  PHQ - 2 Score 0 0 0   Fall Risk  01/18/2017 09/18/2015  Falls in the past year? No No        Assessment & Plan:   1. Poison ivy dermatitis    -New onset; rx for Prednisone provided. -recommend Benadryl qhs and topical benadryl gel. -local wound care reviewed; keep covered and clean during the day; remove bandages at night.  No orders of the defined types were placed in this encounter.  Meds ordered this encounter  Medications  . predniSONE (DELTASONE) 20 MG tablet    Sig: Two tablets daily x 5 days then one tablet daily x 5 days    Dispense:  15 tablet    Refill:  0    No Follow-up on file.   Calen Posch Elayne Guerin, M.D. Primary Care at [facility] [address] [place], [place]  [address] [phone] phone [phone] fax

## 2017-01-18 NOTE — Patient Instructions (Addendum)
Take 3 Prednisone with supper tonight, then take two tablets daily x 5 days then take one tablet daily x 4 days. You can continue Benadryl one at bedtime. Recommend Benadryl cream as needed.  You can also continue hydrocortisone cream as needed.   Contact Dermatitis Dermatitis is redness, soreness, and swelling (inflammation) of the skin. Contact dermatitis is a reaction to certain substances that touch the skin. There are two types of contact dermatitis:  Irritant contact dermatitis. This type is caused by something that irritates your skin, such as dry hands from washing them too much. This type does not require previous exposure to the substance for a reaction to occur. This type is more common.  Allergic contact dermatitis. This type is caused by a substance that you are allergic to, such as a nickel allergy or poison ivy. This type only occurs if you have been exposed to the substance (allergen) before. Upon a repeat exposure, your body reacts to the substance. This type is less common.  What are the causes? Many different substances can cause contact dermatitis. Irritant contact dermatitis is most commonly caused by exposure to:  Makeup.  Soaps.  Detergents.  Bleaches.  Acids.  Metal salts, such as nickel.  Allergic contact dermatitis is most commonly caused by exposure to:  Poisonous plants.  Chemicals.  Jewelry.  Latex.  Medicines.  Preservatives in products, such as clothing.  What increases the risk? This condition is more likely to develop in:  People who have jobs that expose them to irritants or allergens.  People who have certain medical conditions, such as asthma or eczema.  What are the signs or symptoms? Symptoms of this condition may occur anywhere on your body where the irritant has touched you or is touched by you. Symptoms include:  Dryness or flaking.  Redness.  Cracks.  Itching.  Pain or a burning feeling.  Blisters.  Drainage  of small amounts of blood or clear fluid from skin cracks.  With allergic contact dermatitis, there may also be swelling in areas such as the eyelids, mouth, or genitals. How is this diagnosed? This condition is diagnosed with a medical history and physical exam. A patch skin test may be performed to help determine the cause. If the condition is related to your job, you may need to see an occupational medicine specialist. How is this treated? Treatment for this condition includes figuring out what caused the reaction and protecting your skin from further contact. Treatment may also include:  Steroid creams or ointments. Oral steroid medicines may be needed in more severe cases.  Antibiotics or antibacterial ointments, if a skin infection is present.  Antihistamine lotion or an antihistamine taken by mouth to ease itching.  A bandage (dressing).  Follow these instructions at home: [place] your skin as needed.  Apply cool compresses to the affected areas.  Try taking a bath with: ? Epsom salts. Follow the instructions on the packaging. You can get these at your local pharmacy or grocery store. ? Baking soda. Pour a small amount into the bath as directed by your health care provider. ? Colloidal oatmeal. Follow the instructions on the packaging. You can get this at your local pharmacy or grocery store.  Try applying baking soda paste to your skin. Stir water into baking soda until it reaches a paste-like consistency.  Do not scratch your skin.  Bathe less frequently, such as every other day.  Bathe in lukewarm water. Avoid using hot water. Medicines  Take or apply over-the-counter and prescription medicines only as told by your health care provider.  If you were prescribed an antibiotic medicine, take or apply your antibiotic as told by your health care provider. Do not stop using the antibiotic even if your condition starts to improve. General instructions  Keep  all follow-up visits as told by your health care provider. This is important.  Avoid the substance that caused your reaction. If you do not know what caused it, keep a journal to try to track what caused it. Write down: ? What you eat. ? What cosmetic products you use. ? What you drink. ? What you wear in the affected area. This includes jewelry.  If you were given a dressing, take care of it as told by your health care provider. This includes when to change and remove it. Contact a health care provider if:  Your condition does not improve with treatment.  Your condition gets worse.  You have signs of infection such as swelling, tenderness, redness, soreness, or warmth in the affected area.  You have a fever.  You have new symptoms. Get help right away if:  You have a severe headache, neck pain, or neck stiffness.  You vomit.  You feel very sleepy.  You notice red streaks coming from the affected area.  Your bone or joint underneath the affected area becomes painful after the skin has healed.  The affected area turns darker.  You have difficulty breathing. This information is not intended to replace advice given to you by your health care provider. Make sure you discuss any questions you have with your health care provider. Document Released: 04/15/2000 Document Revised: 09/24/2015 Document Reviewed: 09/03/2014 Elsevier Interactive Patient Education  2018 [employer].   IF you received an x-ray today, you will receive an invoice from [facility]. Please contact [facility] at [phone] with questions or concerns regarding your invoice.   IF you received labwork today, you will receive an invoice from [facility]. Please contact [facility] at [phone] with questions or concerns regarding your invoice.   Our billing staff will not be able to assist you with questions regarding bills from these companies.  You will be contacted with the lab results as  soon as they are available. The fastest way to get your results is to activate your My Chart account. Instructions are located on the last page of this paperwork. If you have not heard from [place] regarding the results in 2 weeks, please contact this office.

## 2017-09-15 ENCOUNTER — Ambulatory Visit (INDEPENDENT_AMBULATORY_CARE_PROVIDER_SITE_OTHER): Admitting: Urgent Care

## 2017-09-15 ENCOUNTER — Encounter: Payer: Self-pay | Admitting: Urgent Care

## 2017-09-15 VITALS — BP 125/73 | HR 89 | Temp 99.3°F | Resp 18 | Ht <= 58 in | Wt 108.8 lb

## 2017-09-15 DIAGNOSIS — R070 Pain in throat: Secondary | ICD-10-CM

## 2017-09-15 DIAGNOSIS — R0981 Nasal congestion: Secondary | ICD-10-CM | POA: Diagnosis not present

## 2017-09-15 DIAGNOSIS — H938X1 Other specified disorders of right ear: Secondary | ICD-10-CM

## 2017-09-15 DIAGNOSIS — J3489 Other specified disorders of nose and nasal sinuses: Secondary | ICD-10-CM

## 2017-09-15 DIAGNOSIS — R490 Dysphonia: Secondary | ICD-10-CM

## 2017-09-15 DIAGNOSIS — Z9109 Other allergy status, other than to drugs and biological substances: Secondary | ICD-10-CM

## 2017-09-15 MED ORDER — BENZONATATE 100 MG PO CAPS: 100.0000 mg | ORAL_CAPSULE | Freq: Three times a day (TID) | ORAL | 0 refills | Status: DC | PRN

## 2017-09-15 MED ORDER — DOXYCYCLINE HYCLATE 100 MG PO CAPS: 100.0000 mg | ORAL_CAPSULE | Freq: Two times a day (BID) | ORAL | 0 refills | Status: DC

## 2017-09-15 MED ORDER — PREDNISONE 20 MG PO TABS: ORAL_TABLET | ORAL | 0 refills | Status: DC

## 2017-09-15 NOTE — Progress Notes (Signed)
    MRN: [id number] DOB: [birth year]  Subjective:   Tabitha Baker is a 67 y.o. female presenting for 6 day history of sinus congestion, sinus pain, right ear fullness, throat pain, hoarseness, dry cough. Denies ear pain, ear drainage, chest pain, shob, wheezing, n/v, abdominal pain. Denies smoking cigarettes. Denies history of asthma. Has allergies, managed with Allegra-D, Flonase.  Tabitha Baker has a current medication list which includes the following prescription(s): co-enzyme q-10, cranberry, cyclosporine, estrogens (conjugated), ezetimibe, fexofenadine-pseudoephedrine, fluticasone, levothyroxine, and red yeast rice. Also is allergic to codeine; erythromycin; and sulfa antibiotics.  Tabitha Baker  has a past medical history of Allergy, Cancer ([place]), Cataract, Osteoporosis, and Thyroid disease. Also  has a past surgical history that includes Appendectomy; Eye surgery; Hernia repair; and Abdominal hysterectomy.  Objective:   Vitals: BP 125/73   Pulse 89   Temp 99.3 F (37.4 C) (Oral)   Resp 18   Ht 4\' 10"  (1.473 m)   Wt 108 lb 12.8 oz (49.4 kg)   SpO2 98%   BMI 22.74 kg/m   Physical Exam  Constitutional: She is oriented to person, place, and time. She appears well-developed and well-nourished.  HENT:  Bilateral maxillary sinus tenderness.  TMs clear bilaterally.  Throat with slight postnasal drainage and posterior erythema.  No tonsillar edema or exudates.  Eyes: Right eye exhibits no discharge. Left eye exhibits no discharge.  Neck: Normal range of motion. Neck supple.  Cardiovascular: Normal rate, regular rhythm and intact distal pulses. Exam reveals no gallop and no friction rub.  No murmur heard. Pulmonary/Chest: No respiratory distress. She has no wheezes. She has no rales.  Lymphadenopathy:    She has no cervical adenopathy.  Neurological: She is alert and oriented to person, place, and time.  Skin: Skin is warm and dry.   Assessment and Plan :   Sinus pain  Throat pain  Sinus  congestion  Sensation of fullness in right ear  Hoarseness of voice  Environmental allergies  Patient did not want to try amoxicillin.  We agreed to have her start doxycycline for the next 7 days.  Also recommended that she try prednisone for severe sinus congestion and throat discomfort. Counseled patient on potential for adverse effects with medications prescribed today, patient verbalized understanding. Return-to-clinic precautions discussed, patient verbalized understanding.   Jaynee Eagles, PA-C Primary Care at [place] [phone] 09/15/2017  2:29 PM

## 2017-09-15 NOTE — Patient Instructions (Addendum)
Hydrate well with at least 2 liters (1 gallon) of water daily. For sore throat try using a honey-based tea. Use 3 teaspoons of honey with juice squeezed from half lemon. Place shaved pieces of ginger into 1/2-1 cup of water and warm over stove top. Then mix the ingredients and repeat every 4 hours as needed.     IF you received an x-ray today, you will receive an invoice from [facility]. Please contact [facility] at [phone] with questions or concerns regarding your invoice.   IF you received labwork today, you will receive an invoice from [facility]. Please contact [facility] at [phone] with questions or concerns regarding your invoice.   Our billing staff will not be able to assist you with questions regarding bills from these companies.  You will be contacted with the lab results as soon as they are available. The fastest way to get your results is to activate your My Chart account. Instructions are located on the last page of this paperwork. If you have not heard from [place] regarding the results in 2 weeks, please contact this office.

## 2017-09-26 ENCOUNTER — Encounter: Payer: Self-pay | Admitting: Family Medicine

## 2018-02-20 ENCOUNTER — Other Ambulatory Visit (HOSPITAL_COMMUNITY)
Admission: AD | Admit: 2018-02-20 | Discharge: 2018-02-20 | Disposition: A | Discharge status: Home / Self Care | Payer: Medicare Other | Source: Ambulatory Visit

## 2018-02-20 DIAGNOSIS — Z029 Encounter for administrative examinations, unspecified: Secondary | ICD-10-CM | POA: Diagnosis present

## 2018-02-20 LAB — COMPREHENSIVE METABOLIC PANEL
ALK PHOS: 61 U/L (ref 38–126)
ALT: 26 U/L (ref 0–44)
ANION GAP: 7 (ref 5–15)
AST: 27 U/L (ref 15–41)
Albumin: 3.9 g/dL (ref 3.5–5.0)
BUN: 13 mg/dL (ref 8–23)
CALCIUM: 9.3 mg/dL (ref 8.9–10.3)
CO2: 28 mmol/L (ref 22–32)
CREATININE: 0.85 mg/dL (ref 0.44–1.00)
Chloride: 104 mmol/L (ref 98–111)
Glucose, Bld: 96 mg/dL (ref 70–99)
Potassium: 3.8 mmol/L (ref 3.5–5.1)
Sodium: 139 mmol/L (ref 135–145)
TOTAL PROTEIN: 6.3 g/dL — AB (ref 6.5–8.1)
Total Bilirubin: 0.8 mg/dL (ref 0.3–1.2)

## 2018-02-20 LAB — LIPID PANEL
CHOLESTEROL: 198 mg/dL (ref 0–200)
HDL: 71 mg/dL (ref >40)
LDL Cholesterol: 112 mg/dL — ABNORMAL HIGH (ref 0–99)
Total CHOL/HDL Ratio: 2.8 RATIO
Triglycerides: 74 mg/dL (ref <150)
VLDL: 15 mg/dL (ref 0–40)

## 2018-02-20 LAB — T4, FREE: FREE T4: 1.23 ng/dL (ref 0.82–1.77)

## 2018-02-20 LAB — TSH: TSH: 4.303 u[IU]/mL (ref 0.350–4.500)

## 2018-02-21 LAB — T3: T3 TOTAL: 101 ng/dL (ref 71–180)

## 2019-09-05 ENCOUNTER — Other Ambulatory Visit: Payer: Self-pay

## 2019-09-05 ENCOUNTER — Encounter (HOSPITAL_COMMUNITY): Payer: Self-pay

## 2019-09-05 ENCOUNTER — Ambulatory Visit (HOSPITAL_COMMUNITY)
Admission: EM | Admit: 2019-09-05 | Discharge: 2019-09-05 | Disposition: A | Discharge status: Home / Self Care | Payer: Medicare Other | Attending: Internal Medicine | Admitting: Internal Medicine

## 2019-09-05 DIAGNOSIS — N390 Urinary tract infection, site not specified: Secondary | ICD-10-CM | POA: Diagnosis present

## 2019-09-05 HISTORY — DX: Urinary tract infection, site not specified: N39.0

## 2019-09-05 LAB — POCT URINALYSIS DIP (DEVICE)
Bilirubin Urine: NEGATIVE
Glucose, UA: NEGATIVE mg/dL
Ketones, ur: NEGATIVE mg/dL
Nitrite: NEGATIVE
Protein, ur: NEGATIVE mg/dL
Specific Gravity, Urine: 1.01 (ref 1.005–1.030)
Urobilinogen, UA: 0.2 mg/dL (ref 0.0–1.0)
pH: 7 (ref 5.0–8.0)

## 2019-09-05 MED ORDER — CEPHALEXIN 500 MG PO CAPS: 500.0000 mg | ORAL_CAPSULE | Freq: Two times a day (BID) | ORAL | 0 refills | Status: AC

## 2019-09-05 MED ORDER — PHENAZOPYRIDINE HCL 200 MG PO TABS: 200.0000 mg | ORAL_TABLET | Freq: Three times a day (TID) | ORAL | 0 refills | Status: DC

## 2019-09-05 NOTE — ED Provider Notes (Signed)
[place]    CSN: [id number] Arrival date & time: 09/05/19  1006      History   Chief Complaint Chief Complaint  Patient presents with  . Urinary Tract Infection    HPI Tabitha Baker is a 69 y.o. female history of osteoporosis, cataracts, presenting today for evaluation of UTI.  Patient notes that over the past week she has had dysuria, urinary frequency urgency as well as some spasming of her bladder.  Feels similar to prior UTIs.  Denies any abdominal pain/back pain.  Did have isolated episode of feeling feverish last night, no persistent fevers.  Denies nausea or vomiting.  HPI  Past Medical History:  Diagnosis Date  . Allergy   . Cancer ([place])   . Cataract   . Osteoporosis   . Thyroid disease   . UTI (urinary tract infection)     There are no problems to display for this patient.   Past Surgical History:  Procedure Laterality Date  . ABDOMINAL HYSTERECTOMY    . APPENDECTOMY    . EYE SURGERY    . HERNIA REPAIR      OB History   No obstetric history on file.      Home Medications    Prior to Admission medications   Medication Sig Start Date End Date Taking? Authorizing Provider  Calcium Carb-Cholecalciferol (CALCIUM 500/D) 500-400 MG-UNIT CHEW Chew by mouth.   Yes [provider]  cholecalciferol (VITAMIN D3) 25 MCG (1000 UNIT) tablet Take 1,000 Units by mouth daily.   Yes [provider]  glucosamine-chondroitin 500-400 MG tablet Take 1 tablet by mouth 3 (three) times daily.   Yes [provider]  Lutein 20 MG CAPS Take by mouth.   Yes [provider]  Omega-3 Fatty Acids (FISH OIL) 1000 MG CAPS Take by mouth.   Yes [provider]  Turmeric (QC TUMERIC COMPLEX) 500 MG CAPS Take by mouth.   Yes [provider]  cephALEXin (KEFLEX) 500 MG capsule Take 1 capsule (500 mg total) by mouth 2 (two) times daily for 7 days. 09/05/19 09/12/19  Vlasta Baskin C, PA-C  CRANBERRY EXTRACT PO Take by mouth  daily.    [provider]  cycloSPORINE (RESTASIS) 0.05 % ophthalmic emulsion 1 drop 2 (two) times daily.    [provider]  estrogens, conjugated, (PREMARIN) 0.625 MG tablet Take 0.625 mg by mouth daily. Take daily for 21 days then do not take for 7 days.    [provider]  ezetimibe (ZETIA) 10 MG tablet Take 10 mg by mouth daily.    [provider]  fexofenadine-pseudoephedrine (ALLEGRA-D 24) 180-240 MG 24 hr tablet Take 1 tablet by mouth daily.    [provider]  fluticasone (VERAMYST) 27.5 MCG/SPRAY nasal spray Place 2 sprays into the nose daily.    [provider]  levothyroxine (SYNTHROID, LEVOTHROID) 88 MCG tablet Take 75 mcg by mouth daily.     [provider]  phenazopyridine (PYRIDIUM) 200 MG tablet Take 1 tablet (200 mg total) by mouth 3 (three) times daily. 09/05/19   Zenith Kercheval C, PA-C  Red Yeast Rice 600 MG CAPS Take by mouth 4 (four) times daily.    [provider]    Family History Family History  Problem Relation Age of Onset  . Cancer Mother   . Hyperlipidemia Mother   . Hypertension Mother   . Heart disease Father   . Stroke Father   . Cancer Brother   . Heart  disease Brother   . Hypertension Maternal Grandmother   . Stroke Maternal Grandmother   . Cancer Maternal Grandfather   . Heart disease Maternal Grandfather   . Stroke Paternal Grandmother   . Cancer Paternal Grandfather     Social History Social History   Tobacco Use  . Smoking status: Never Smoker  . Smokeless tobacco: Never Used  Substance Use Topics  . Alcohol use: No    Alcohol/week: 0.0 standard drinks  . Drug use: No     Allergies   Codeine, Erythromycin, and Sulfa antibiotics   Review of Systems Review of Systems  Constitutional: Negative for fever.  Respiratory: Negative for shortness of breath.   Cardiovascular: Negative for chest pain.  Gastrointestinal: Negative for abdominal pain, diarrhea, nausea and  vomiting.  Genitourinary: Positive for dysuria, frequency and urgency. Negative for flank pain, genital sores, hematuria, menstrual problem, vaginal bleeding, vaginal discharge and vaginal pain.  Musculoskeletal: Negative for back pain.  Skin: Negative for rash.  Neurological: Negative for dizziness, light-headedness and headaches.     Physical Exam Triage Vital Signs ED Triage Vitals  Enc Vitals Group     BP 09/05/19 1042 (!) 147/73     Pulse Rate 09/05/19 1042 77     Resp 09/05/19 1042 16     Temp 09/05/19 1042 97.9 F (36.6 C)     Temp Source 09/05/19 1042 Oral     SpO2 09/05/19 1042 98 %     Weight 09/05/19 1043 107 lb (48.5 kg)     Height 09/05/19 1043 4' 9.5" (1.461 m)     Head Circumference --      Peak Flow --      Pain Score 09/05/19 1043 0     Pain Loc --      Pain Edu? --      Excl. in [place]? --    No data found.  Updated Vital Signs BP (!) 147/73   Pulse 77   Temp 97.9 F (36.6 C) (Oral)   Resp 16   Ht 4' 9.5" (1.461 m)   Wt 107 lb (48.5 kg)   SpO2 98%   BMI 22.75 kg/m   Visual Acuity Right Eye Distance:   Left Eye Distance:   Bilateral Distance:    Right Eye Near:   Left Eye Near:    Bilateral Near:     Physical Exam Vitals and nursing note reviewed.  Constitutional:      Appearance: She is well-developed.     Comments: No acute distress  HENT:     Head: Normocephalic and atraumatic.     Nose: Nose normal.  Eyes:     Conjunctiva/sclera: Conjunctivae normal.  Cardiovascular:     Rate and Rhythm: Normal rate and regular rhythm.  Pulmonary:     Effort: Pulmonary effort is normal. No respiratory distress.     Comments: Breathing comfortably at rest, CTABL, no wheezing, rales or other adventitious sounds auscultated Abdominal:     General: There is no distension.     Comments: Soft, nondistended, nontender light deep palpation throughout abdomen  Musculoskeletal:        General: Normal range of motion.     Cervical back: Neck supple.   Skin:    General: Skin is warm and dry.  Neurological:     Mental Status: She is alert and oriented to person, place, and time.      UC Treatments / Results  Labs (all labs ordered are listed, but only abnormal results  are displayed) Labs Reviewed  POCT URINALYSIS DIP (DEVICE) - Abnormal; Notable for the following components:      Result Value   Hgb urine dipstick TRACE (*)    Leukocytes,Ua SMALL (*)    All other components within normal limits  URINE CULTURE    EKG   Radiology No results found.  Procedures Procedures (including critical care time)  Medications Ordered in UC Medications - No data to display  Initial Impression / Assessment and Plan / UC Course  I have reviewed the triage vital signs and the nursing notes.  Pertinent labs & imaging results that were available during my care of the patient were reviewed by me and considered in my medical decision making (see chart for details).     Small leuks on UA, feel similar to prior UTIs. Treating for UTI. Treating with Keflex twice daily x1 week.  Urine culture pending.  Pyridium for discomfort.  Rest and push fluids.  Discussed strict return precautions. Patient verbalized understanding and is agreeable with plan.  Final Clinical Impressions(s) / UC Diagnoses   Final diagnoses:  Lower urinary tract infectious disease     Discharge Instructions     Urine showed evidence of infection. We are treating you with keflex-twice daily for 1 week. Be sure to take full course. Stay hydrated- urine should be pale yellow to clear. May use pyridium to help with burning  Please return or follow up with your primary provider if symptoms not improving with treatment. Please return sooner if you have worsening of symptoms or develop fever, nausea, vomiting, abdominal pain, back pain, lightheadedness, dizziness.   ED Prescriptions    Medication Sig Dispense Auth. Provider   cephALEXin (KEFLEX) 500 MG capsule Take 1  capsule (500 mg total) by mouth 2 (two) times daily for 7 days. 14 capsule Kayleeann Huxford C, PA-C   phenazopyridine (PYRIDIUM) 200 MG tablet Take 1 tablet (200 mg total) by mouth 3 (three) times daily. 6 tablet Jazier Mcglamery, [place] C, PA-C     PDMP not reviewed this encounter.   Janith Lima, PA-C 09/05/19 1131

## 2019-09-05 NOTE — Discharge Instructions (Signed)
Urine showed evidence of infection. We are treating you with keflex-twice daily for 1 week. Be sure to take full course. Stay hydrated- urine should be pale yellow to clear. May use pyridium to help with burning  Please return or follow up with your primary provider if symptoms not improving with treatment. Please return sooner if you have worsening of symptoms or develop fever, nausea, vomiting, abdominal pain, back pain, lightheadedness, dizziness.

## 2019-09-05 NOTE — ED Triage Notes (Signed)
Pt c/o dysuria, urinary urgency, frequent urinationx1.5 wks.

## 2019-09-07 LAB — URINE CULTURE: Culture: 80000 — AB

## 2020-02-14 DIAGNOSIS — N390 Urinary tract infection, site not specified: Secondary | ICD-10-CM | POA: Insufficient documentation

## 2020-02-14 DIAGNOSIS — E079 Disorder of thyroid, unspecified: Secondary | ICD-10-CM | POA: Insufficient documentation

## 2020-02-14 DIAGNOSIS — Z8589 Personal history of malignant neoplasm of other organs and systems: Secondary | ICD-10-CM | POA: Insufficient documentation

## 2020-02-14 DIAGNOSIS — E032 Hypothyroidism due to medicaments and other exogenous substances: Secondary | ICD-10-CM | POA: Insufficient documentation

## 2020-02-14 DIAGNOSIS — Q963 Mosaicism, 45, X/46, XX or XY: Secondary | ICD-10-CM | POA: Insufficient documentation

## 2020-02-14 DIAGNOSIS — C801 Malignant (primary) neoplasm, unspecified: Secondary | ICD-10-CM | POA: Insufficient documentation

## 2020-02-14 DIAGNOSIS — E039 Hypothyroidism, unspecified: Secondary | ICD-10-CM | POA: Insufficient documentation

## 2020-02-14 DIAGNOSIS — E785 Hyperlipidemia, unspecified: Secondary | ICD-10-CM | POA: Insufficient documentation

## 2020-02-14 DIAGNOSIS — T7840XA Allergy, unspecified, initial encounter: Secondary | ICD-10-CM | POA: Insufficient documentation

## 2020-02-14 DIAGNOSIS — H269 Unspecified cataract: Secondary | ICD-10-CM | POA: Insufficient documentation

## 2020-02-14 DIAGNOSIS — I351 Nonrheumatic aortic (valve) insufficiency: Secondary | ICD-10-CM | POA: Insufficient documentation

## 2020-02-14 DIAGNOSIS — H9190 Unspecified hearing loss, unspecified ear: Secondary | ICD-10-CM | POA: Insufficient documentation

## 2020-02-14 DIAGNOSIS — M810 Age-related osteoporosis without current pathological fracture: Secondary | ICD-10-CM | POA: Insufficient documentation

## 2020-02-14 DIAGNOSIS — C8510 Unspecified B-cell lymphoma, unspecified site: Secondary | ICD-10-CM | POA: Insufficient documentation

## 2020-02-14 DIAGNOSIS — I071 Rheumatic tricuspid insufficiency: Secondary | ICD-10-CM | POA: Insufficient documentation

## 2020-02-17 ENCOUNTER — Ambulatory Visit (INDEPENDENT_AMBULATORY_CARE_PROVIDER_SITE_OTHER): Payer: Medicare Other | Admitting: Cardiology

## 2020-02-17 ENCOUNTER — Encounter: Payer: Self-pay | Admitting: Cardiology

## 2020-02-17 ENCOUNTER — Other Ambulatory Visit: Payer: Self-pay

## 2020-02-17 VITALS — BP 136/70 | HR 65 | Ht <= 58 in | Wt 104.0 lb

## 2020-02-17 DIAGNOSIS — R0600 Dyspnea, unspecified: Secondary | ICD-10-CM

## 2020-02-17 DIAGNOSIS — I071 Rheumatic tricuspid insufficiency: Secondary | ICD-10-CM

## 2020-02-17 DIAGNOSIS — I351 Nonrheumatic aortic (valve) insufficiency: Secondary | ICD-10-CM | POA: Diagnosis not present

## 2020-02-17 DIAGNOSIS — E782 Mixed hyperlipidemia: Secondary | ICD-10-CM | POA: Insufficient documentation

## 2020-02-17 DIAGNOSIS — R0609 Other forms of dyspnea: Secondary | ICD-10-CM | POA: Insufficient documentation

## 2020-02-17 DIAGNOSIS — Q963 Mosaicism, 45, X/46, XX or XY: Secondary | ICD-10-CM | POA: Diagnosis not present

## 2020-02-17 HISTORY — DX: Dyspnea, unspecified: R06.00

## 2020-02-17 HISTORY — DX: Mixed hyperlipidemia: E78.2

## 2020-02-17 HISTORY — DX: Other forms of dyspnea: R06.09

## 2020-02-17 NOTE — Patient Instructions (Addendum)
Medication Instructions:  No medication changes. *If you need a refill on your cardiac medications before your next appointment, please call your pharmacy*   Lab Work: None ordered If you have labs (blood work) drawn today and your tests are completely normal, you will receive your results only by: MyChart Message (if you have MyChart) OR A paper copy in the mail If you have any lab test that is abnormal or we need to change your treatment, we will call you to review the results.   Testing/Procedures: Your physician has requested that you have a lexiscan myoview. For further information please visit [email or web address]. Please follow instruction sheet, as given.  The test will take approximately 3 to 4 hours to complete; you may bring reading material.  If someone comes with you to your appointment, they will need to remain in the main lobby due to limited space in the testing area. **If you are pregnant or breastfeeding, please notify the nuclear lab prior to your appointment**  How to prepare for your Myocardial Perfusion Test: Do not eat or drink 3 hours prior to your test, except you may have water. Do not consume products containing caffeine (regular or decaffeinated) 12 hours prior to your test. (ex: coffee, chocolate, sodas, tea). Do bring a list of your current medications with you.  If not listed below, you may take your medications as normal. Do wear comfortable clothes (no dresses or overalls) and walking shoes, tennis shoes preferred (No heels or open toe shoes are allowed). Do NOT wear cologne, perfume, aftershave, or lotions (deodorant is allowed). If these instructions are not followed, your test will have to be rescheduled.    Follow-Up: At [facility], you and your health needs are our priority.  As part of our continuing mission to provide you with exceptional heart care, we have created designated Provider Care Teams.  These Care Teams include your primary  Cardiologist (physician) and Advanced Practice Providers (APPs -  Physician Assistants and Nurse Practitioners) who all work together to provide you with the care you need, when you need it.  We recommend signing up for the patient portal called "MyChart".  Sign up information is provided on this After Visit Summary.  MyChart is used to connect with patients for Virtual Visits (Telemedicine).  Patients are able to view lab/test results, encounter notes, upcoming appointments, etc.  Non-urgent messages can be sent to your provider as well.   To learn more about what you can do with MyChart, go to [email or web address].    Your next appointment:   3 month(s)  The format for your next appointment:   In Person  Provider:   Rajan Revankar, MD   Other Instructions Cardiac Nuclear Scan A cardiac nuclear scan is a test that is done to check the flow of blood to your heart. It is done when you are resting and when you are exercising. The test looks for problems such as: Not enough blood reaching a portion of the heart. The heart muscle not working as it should. You may need this test if: You have heart disease. You have had lab results that are not normal. You have had heart surgery or a balloon procedure to open up blocked arteries (angioplasty). You have chest pain. You have shortness of breath. In this test, a special dye (tracer) is put into your bloodstream. The tracer will travel to your heart. A camera will then take pictures of your heart to see how the tracer moves through your heart.   will then take pictures of your heart to see how the tracer moves through your heart. This test is usually done at a hospital and takes 2-4 hours. Tell a doctor about:  Any allergies you have.  All medicines you are taking, including vitamins, herbs, eye drops, creams, and over-the-counter medicines.  Any problems you or family members have had with anesthetic medicines.  Any blood disorders you have.  Any surgeries you have had.  Any medical conditions  you have.  Whether you are pregnant or may be pregnant. What are the risks? Generally, this is a safe test. However, problems may occur, such as:  Serious chest pain and heart attack. This is only a risk if the stress portion of the test is done.  Rapid heartbeat.  A feeling of warmth in your chest. This feeling usually does not last long.  Allergic reaction to the tracer. What happens before the test?  Ask your doctor about changing or stopping your normal medicines. This is important.  Follow instructions from your doctor about what you cannot eat or drink.  Remove your jewelry on the day of the test. What happens during the test?  An IV tube will be inserted into one of your veins.  Your doctor will give you a small amount of tracer through the IV tube.  You will wait for 20-40 minutes while the tracer moves through your bloodstream.  Your heart will be monitored with an electrocardiogram (ECG).  You will lie down on an exam table.  Pictures of your heart will be taken for about 15-20 minutes.  You may also have a stress test. For this test, one of these things may be done: ? You will be asked to exercise on a treadmill or a stationary bike. ? You will be given medicines that will make your heart work harder. This is done if you are unable to exercise.  When blood flow to your heart has peaked, a tracer will again be given through the IV tube.  After 20-40 minutes, you will get back on the exam table. More pictures will be taken of your heart.  Depending on the tracer that is used, more pictures may need to be taken 3-4 hours later.  Your IV tube will be removed when the test is over. The test may vary among doctors and hospitals. What happens after the test?  Ask your doctor: ? Whether you can return to your normal schedule, including diet, activities, and medicines. ? Whether you should drink more fluids. This will help to remove the tracer from your body. Drink  enough fluid to keep your pee (urine) pale yellow.  Ask your doctor, or the department that is doing the test: ? When will my results be ready? ? How will I get my results? Summary  A cardiac nuclear scan is a test that is done to check the flow of blood to your heart.  Tell your doctor whether you are pregnant or may be pregnant.  Before the test, ask your doctor about changing or stopping your normal medicines. This is important.  Ask your doctor whether you can return to your normal activities. You may be asked to drink more fluids. This information is not intended to replace advice given to you by your health care provider. Make sure you discuss any questions you have with your health care provider. Document Revised: 08/08/2018 Document Reviewed: 10/02/2017 Elsevier Patient Education  [place].

## 2020-02-17 NOTE — Progress Notes (Signed)
Cardiology Office Note:    Date:  02/17/2020   ID:  Tabitha Baker, DOB 05-23-1950, MRN [id number]  PCP:  Johna Roles, PA  Cardiologist:  Jenean Lindau, MD   Referring MD: Delrae Rend, MD    ASSESSMENT:    1. Mild aortic regurgitation   2. Mild tricuspid regurgitation   3. DOE (dyspnea on exertion)   4. Turner syndrome with mosaicism   5. Mixed dyslipidemia    PLAN:    In order of problems listed above:  1. Primary prevention stressed with the patient.  Importance of compliance with diet medication stressed and she vocalized understanding.  I did extensive review of records sent by primary care physician. 2. Mild to moderate tricuspid regurgitation: Stable at this time.  Medical management.  Pulmonary pressures were reported to be normal on that report. 3. Mild aortic regurgitation: Stable medical management.  Blood pressure is stable. 4. Dyspnea on exertion: She is concerned that this is a little more than she experiences during exercise.  We will do a Lexiscan sestamibi to rule out any objective evidence of coronary artery disease. 5. Mild mixed dyslipidemia: Diet was emphasized.  She promises to do better. 6. Patient will be seen in follow-up appointment in 6 months or earlier if the patient has any concerns   Medication Adjustments/Labs and Tests Ordered: Current medicines are reviewed at length with the patient today.  Concerns regarding medicines are outlined above.  Orders Placed This Encounter  Procedures  . MYOCARDIAL PERFUSION IMAGING  . EKG 12-Lead   No orders of the defined types were placed in this encounter.    History of Present Illness:    Tabitha Baker is a 69 y.o. female who is being seen today for the evaluation of abnormal echocardiogram and dyspnea on exertion at the request of Delrae Rend, MD.  Patient is a pleasant 69 year old female.  She gives history of Turner syndrome.  Echocardiogram has revealed mild aortic regurgitation mild to  moderate tricuspid and mild mitral regurgitation.  She denies any problems at this time and takes care of activities of daily living.  No chest pain orthopnea or PND.  She mentions to me that she is out of breath more than usual in the past few months.  And wants to be evaluated for this.  At the time of my evaluation, the patient is alert awake oriented and in no distress.  Past Medical History:  Diagnosis Date  . Allergy   . 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 Cancer ([place])   . Cataract   . Hearing loss   . History of squamous cell carcinoma   . Hyperlipidemia, unspecified   . Hypothyroidism due to medication   . Mild aortic regurgitation   . Mild tricuspid regurgitation   . Osteoporosis   . Thyroid disease   . Turner syndrome with mosaicism   . UTI (urinary tract infection)     Past Surgical History:  Procedure Laterality Date  . ABDOMINAL HYSTERECTOMY    . APPENDECTOMY    . EYE SURGERY    . HERNIA REPAIR      Current Medications: Current Meds  Medication Sig  . alendronate (FOSAMAX) 35 MG tablet Take 35 mg by mouth every 7 (seven) days. Take with a full glass of water on an empty stomach.  . Calcium Carb-Cholecalciferol (CALCIUM 500/D) 500-400 MG-UNIT CHEW Chew by mouth.  . cholecalciferol (VITAMIN D3) 25 MCG (1000 UNIT) tablet Take 1,000 Units by mouth daily.  Marland Kitchen conjugated  estrogens (PREMARIN) vaginal cream Place 1 Applicatorful vaginally daily.  Marland Kitchen CRANBERRY EXTRACT PO Take by mouth daily.  . cycloSPORINE (RESTASIS) 0.05 % ophthalmic emulsion 1 drop 2 (two) times daily.  Marland Kitchen ezetimibe (ZETIA) 10 MG tablet Take 10 mg by mouth daily.  . fluticasone (FLONASE) 50 MCG/ACT nasal spray Place 1 spray into both nostrils daily.  Marland Kitchen glucosamine-chondroitin 500-400 MG tablet Take 1 tablet by mouth 3 (three) times daily.  Marland Kitchen levothyroxine (SYNTHROID) 75 MCG tablet Take 75 mcg by mouth daily before breakfast.  . Lutein 20 MG CAPS Take by mouth.  . Omega-3 Fatty Acids (FISH OIL) 1000 MG CAPS Take  1,000 mg by mouth daily.  . Red Yeast Rice 600 MG CAPS Take by mouth 4 (four) times daily.  . Turmeric (QC TUMERIC COMPLEX) 500 MG CAPS Take by mouth.  . Vitamin D, Cholecalciferol, 25 MCG (1000 UT) TABS Take 25 mcg by mouth daily.     Allergies:   Codeine, Erythromycin, Fentanyl, Sulfa antibiotics, and Tape   Social History   Socioeconomic History  . Marital status: Widowed    Spouse name: Not on file  . Number of children: Not on file  . Years of education: Not on file  . Highest education level: Not on file  Occupational History  . Not on file  Tobacco Use  . Smoking status: Never Smoker  . Smokeless tobacco: Never Used  Substance and Sexual Activity  . Alcohol use: No    Alcohol/week: 0.0 standard drinks  . Drug use: No  . Sexual activity: Not on file  Other Topics Concern  . Not on file  Social History Narrative  . Not on file   Social Determinants of Health   Financial Resource Strain:   . Difficulty of Paying Living Expenses: Not on file  Food Insecurity:   . Worried About Charity fundraiser in the Last Year: Not on file  . Ran Out of Food in the Last Year: Not on file  Transportation Needs:   . Lack of Transportation (Medical): Not on file  . Lack of Transportation (Non-Medical): Not on file  Physical Activity:   . Days of Exercise per Week: Not on file  . Minutes of Exercise per Session: Not on file  Stress:   . Feeling of Stress : Not on file  Social Connections:   . Frequency of Communication with Friends and Family: Not on file  . Frequency of Social Gatherings with Friends and Family: Not on file  . Attends Religious Services: Not on file  . Active Member of Clubs or Organizations: Not on file  . Attends Archivist Meetings: Not on file  . Marital Status: Not on file     Family History: The patient's family history includes Cancer in her brother, maternal grandfather, mother, and paternal grandfather; Heart disease in her brother,  father, and maternal grandfather; Hyperlipidemia in her mother; Hypertension in her maternal grandmother and mother; Stroke in her father, maternal grandmother, and paternal grandmother.  ROS:   Please see the history of present illness.    All other systems reviewed and are negative.  EKGs/Labs/Other Studies Reviewed:    The following studies were reviewed today: EKG was sinus rhythm and nonspecific ST-T changes.   Recent Labs: No results found for requested labs within last 8760 hours.  Recent Lipid Panel    Component Value Date/Time   CHOL 198 02/20/2018 0700   TRIG 74 02/20/2018 0700   HDL 71 02/20/2018 0700  CHOLHDL 2.8 02/20/2018 0700   VLDL 15 02/20/2018 0700   LDLCALC 112 (H) 02/20/2018 0700    Physical Exam:    VS:  BP 136/70   Pulse 65   Ht 4\' 10"  (1.473 m)   Wt 104 lb 0.6 oz (47.2 kg)   SpO2 97%   BMI 21.74 kg/m     Wt Readings from Last 3 Encounters:  02/17/20 104 lb 0.6 oz (47.2 kg)  09/05/19 107 lb (48.5 kg)  09/15/17 108 lb 12.8 oz (49.4 kg)     GEN: Patient is in no acute distress HEENT: Normal NECK: No JVD; No carotid bruits LYMPHATICS: No lymphadenopathy CARDIAC: S1 S2 regular, 2/6 systolic murmur at the apex. RESPIRATORY:  Clear to auscultation without rales, wheezing or rhonchi  ABDOMEN: Soft, non-tender, non-distended MUSCULOSKELETAL:  No edema; No deformity  SKIN: Warm and dry NEUROLOGIC:  Alert and oriented x 3 PSYCHIATRIC:  Normal affect    Signed, Jenean Lindau, MD  02/17/2020 11:22 AM    [facility]

## 2020-02-18 NOTE — Addendum Note (Signed)
Addended by: Sophina Mitten, Jonelle Sidle L on: 02/18/2020 03:08 PM   Modules accepted: Orders

## 2020-02-19 ENCOUNTER — Telehealth (HOSPITAL_COMMUNITY): Payer: Self-pay | Admitting: *Deleted

## 2020-02-19 NOTE — Telephone Encounter (Signed)
Patient given detailed instructions per Myocardial Perfusion Study Information Sheet for the test on 02/21/20 at 7:45. Patient notified to arrive 15 minutes early and that it is imperative to arrive on time for appointment to keep from having the test rescheduled.  If you need to cancel or reschedule your appointment, please call the office within 24 hours of your appointment. . Patient verbalized understanding.Tabitha Baker

## 2020-02-21 ENCOUNTER — Other Ambulatory Visit: Payer: Self-pay

## 2020-02-21 ENCOUNTER — Ambulatory Visit (HOSPITAL_COMMUNITY): Payer: Medicare Other | Attending: Internal Medicine

## 2020-02-21 DIAGNOSIS — R0600 Dyspnea, unspecified: Secondary | ICD-10-CM

## 2020-02-21 DIAGNOSIS — R0609 Other forms of dyspnea: Secondary | ICD-10-CM

## 2020-02-21 LAB — MYOCARDIAL PERFUSION IMAGING
LV dias vol: 56 mL (ref 46–106)
LV sys vol: 17 mL
Peak HR: 105 {beats}/min
Rest HR: 64 {beats}/min
SDS: 3
SRS: 1
SSS: 4
TID: 0.98

## 2020-02-21 MED ORDER — REGADENOSON 0.4 MG/5ML IV SOLN
0.4000 mg | Freq: Once | INTRAVENOUS | Status: AC
  Administered 2020-02-21: 0.4 mg via INTRAVENOUS

## 2020-02-21 MED ORDER — TECHNETIUM TC 99M TETROFOSMIN IV KIT
31.6000 | PACK | Freq: Once | INTRAVENOUS | Status: AC | PRN
  Administered 2020-02-21: 31.6 via INTRAVENOUS
  Filled 2020-02-21: qty 32

## 2020-02-21 MED ORDER — TECHNETIUM TC 99M TETROFOSMIN IV KIT
10.3000 | PACK | Freq: Once | INTRAVENOUS | Status: AC | PRN
  Administered 2020-02-21: 10.3 via INTRAVENOUS
  Filled 2020-02-21: qty 11

## 2020-02-25 ENCOUNTER — Telehealth: Payer: Self-pay

## 2020-02-25 NOTE — Telephone Encounter (Signed)
Spoke with patient regarding results and recommendation.  Patient verbalizes understanding and is agreeable to plan of care. Advised patient to call back with any issues or concerns.  

## 2020-02-25 NOTE — Telephone Encounter (Signed)
-----   Message from Jenean Lindau, MD sent at 02/25/2020  8:44 AM EDT ----- The results of the study is unremarkable. Please inform patient. I will discuss in detail at next appointment. Cc  primary care/referring physician Jenean Lindau, MD 02/25/2020 8:44 AM

## 2020-09-01 ENCOUNTER — Encounter: Payer: Self-pay | Admitting: Cardiology

## 2020-09-01 ENCOUNTER — Other Ambulatory Visit: Payer: Self-pay

## 2020-09-01 ENCOUNTER — Telehealth: Payer: Self-pay

## 2020-09-01 ENCOUNTER — Telehealth (INDEPENDENT_AMBULATORY_CARE_PROVIDER_SITE_OTHER): Payer: Medicare Other | Admitting: Cardiology

## 2020-09-01 VITALS — Ht <= 58 in | Wt 106.0 lb

## 2020-09-01 DIAGNOSIS — Q963 Mosaicism, 45, X/46, XX or XY: Secondary | ICD-10-CM

## 2020-09-01 DIAGNOSIS — I371 Nonrheumatic pulmonary valve insufficiency: Secondary | ICD-10-CM | POA: Insufficient documentation

## 2020-09-01 DIAGNOSIS — I359 Nonrheumatic aortic valve disorder, unspecified: Secondary | ICD-10-CM

## 2020-09-01 DIAGNOSIS — I351 Nonrheumatic aortic (valve) insufficiency: Secondary | ICD-10-CM | POA: Diagnosis not present

## 2020-09-01 DIAGNOSIS — I079 Rheumatic tricuspid valve disease, unspecified: Secondary | ICD-10-CM

## 2020-09-01 DIAGNOSIS — Q969 Turner's syndrome, unspecified: Secondary | ICD-10-CM

## 2020-09-01 DIAGNOSIS — I071 Rheumatic tricuspid insufficiency: Secondary | ICD-10-CM | POA: Diagnosis not present

## 2020-09-01 DIAGNOSIS — I700 Atherosclerosis of aorta: Secondary | ICD-10-CM | POA: Insufficient documentation

## 2020-09-01 HISTORY — DX: Nonrheumatic pulmonary valve insufficiency: I37.1

## 2020-09-01 HISTORY — DX: Nonrheumatic aortic valve disorder, unspecified: I35.9

## 2020-09-01 HISTORY — DX: Atherosclerosis of aorta: I70.0

## 2020-09-01 HISTORY — DX: Rheumatic tricuspid valve disease, unspecified: I07.9

## 2020-09-01 HISTORY — DX: Turner's syndrome, unspecified: Q96.9

## 2020-09-01 NOTE — Telephone Encounter (Signed)
  Patient Consent for Virtual Visit         Tabitha Baker has provided verbal consent on 09/01/2020 for a virtual visit (video or telephone).   CONSENT FOR VIRTUAL VISIT FOR:  Tabitha Baker  By participating in this virtual visit I agree to the following:  I hereby voluntarily request, consent and authorize [facility] and its employed or contracted physicians, physician assistants, nurse practitioners or other licensed health care professionals (the Practitioner), to provide me with telemedicine health care services (the "Services") as deemed necessary by the treating Practitioner. I acknowledge and consent to receive the Services by the Practitioner via telemedicine. I understand that the telemedicine visit will involve communicating with the Practitioner through live audiovisual communication technology and the disclosure of certain medical information by electronic transmission. I acknowledge that I have been given the opportunity to request an in-person assessment or other available alternative prior to the telemedicine visit and am voluntarily participating in the telemedicine visit.  I understand that I have the right to withhold or withdraw my consent to the use of telemedicine in the course of my care at any time, without affecting my right to future care or treatment, and that the Practitioner or I may terminate the telemedicine visit at any time. I understand that I have the right to inspect all information obtained and/or recorded in the course of the telemedicine visit and may receive copies of available information for a reasonable fee.  I understand that some of the potential risks of receiving the Services via telemedicine include:  Marland Kitchen Delay or interruption in medical evaluation due to technological equipment failure or disruption; . Information transmitted may not be sufficient (e.g. poor resolution of images) to allow for appropriate medical decision making by the Practitioner;  and/or  . In rare instances, security protocols could fail, causing a breach of personal health information.  Furthermore, I acknowledge that it is my responsibility to provide information about my medical history, conditions and care that is complete and accurate to the best of my ability. I acknowledge that Practitioner's advice, recommendations, and/or decision may be based on factors not within their control, such as incomplete or inaccurate data provided by me or distortions of diagnostic images or specimens that may result from electronic transmissions. I understand that the practice of medicine is not an exact science and that Practitioner makes no warranties or guarantees regarding treatment outcomes. I acknowledge that a copy of this consent can be made available to me via my patient portal ([facility]), or I can request a printed copy by calling the office of [facility].    I understand that my insurance will be billed for this visit.   I have read or had this consent read to me. . I understand the contents of this consent, which adequately explains the benefits and risks of the Services being provided via telemedicine.  . I have been provided ample opportunity to ask questions regarding this consent and the Services and have had my questions answered to my satisfaction. . I give my informed consent for the services to be provided through the use of telemedicine in my medical care

## 2020-09-01 NOTE — Progress Notes (Signed)
Virtual Visit via Telephone Note   This visit type was conducted due to national recommendations for restrictions regarding the COVID-19 Pandemic (e.g. social distancing) in an effort to limit this patient's exposure and mitigate transmission in our community.  Due to her co-morbid illnesses, this patient is at least at moderate risk for complications without adequate follow up.  This format is felt to be most appropriate for this patient at this time.  The patient did not have access to video technology/had technical difficulties with video requiring transitioning to audio format only (telephone).  All issues noted in this document were discussed and addressed.  No physical exam could be performed with this format.  Please refer to the patient's chart for her  consent to telehealth for [facility].    Date:  09/01/2020   ID:  Tabitha Baker, DOB [birth year], MRN [id number] The patient was identified using 2 identifiers.  Patient Location: Home Provider Location: Home Office   PCP:  [place], Delrae Rend, PA   [facility] Providers Cardiologist:  None     Evaluation Performed:  Follow-Up Visit  Chief Complaint:  Mild aortic regurgitation  History of Present Illness:    Tabitha Baker is a 70 y.o. 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  Patient was evaluated by me for dyspnea on exertion.  Her stress test was unremarkable.  Subsequently she has done fine.  She has mild aortic regurgitation.  She is happy about her progress. The patient does not have symptoms concerning for COVID-19 infection (fever, chills, cough, or new shortness of breath).    Past Medical History:  Diagnosis Date  . Allergy   . 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 Cancer ([place])   . Cataract   . DOE (dyspnea on exertion) 02/17/2020  . Hearing loss   . History of squamous cell carcinoma   . Hyperlipidemia, unspecified   . Hypothyroidism due to medication   . Mild aortic regurgitation   . Mild tricuspid regurgitation    . Mixed dyslipidemia 02/17/2020  . Osteoporosis   . Thyroid disease   . Turner syndrome with mosaicism   . UTI (urinary tract infection)    Past Surgical History:  Procedure Laterality Date  . ABDOMINAL HYSTERECTOMY    . APPENDECTOMY    . EYE SURGERY    . HERNIA REPAIR       Current Meds  Medication Sig  . alendronate (FOSAMAX) 35 MG tablet Take 35 mg by mouth every 7 (seven) days. Take with a full glass of water on an empty stomach.  . betamethasone dipropionate (DIPROLENE) 0.05 % ointment Apply 1 application topically daily.  . Calcium 600-200 MG-UNIT tablet Take 1 tablet by mouth daily.  . cholecalciferol (VITAMIN D3) 25 MCG (1000 UNIT) tablet Take 1,000 Units by mouth daily.  . Coenzyme Q10 (CO Q10) 200 MG CAPS Take 200 mg by mouth daily.  Marland Kitchen conjugated estrogens (PREMARIN) vaginal cream Place 1 Applicatorful vaginally once a week.  . Cranberry-Vitamin C-Vitamin E (CRANBERRY PLUS VITAMIN C) 4200-20-3 MG-MG-UNIT CAPS Take 8,400 mg by mouth in the morning.  . cycloSPORINE (RESTASIS) 0.05 % ophthalmic emulsion Place 1 drop into both eyes 2 (two) times daily.  Marland Kitchen desoximetasone (TOPICORT) 0.25 % cream Apply 1 application topically as needed for rash.  . doxycycline (VIBRAMYCIN) 100 MG capsule Take 100 mg by mouth 2 (two) times daily.  Marland Kitchen ezetimibe (ZETIA) 10 MG tablet Take 10 mg by mouth daily.  . fexofenadine-pseudoephedrine (ALLEGRA-D 24) 180-240 MG 24 hr tablet  Take 1 tablet by mouth daily.  . fluticasone (FLONASE) 50 MCG/ACT nasal spray Place 1 spray into both nostrils 2 (two) times daily.   Marland Kitchen levothyroxine (SYNTHROID) 75 MCG tablet Take 75 mcg by mouth daily before breakfast.  . Lutein 20 MG CAPS Take 20 mg by mouth daily.  . Omega-3 Fatty Acids (FISH OIL) 1000 MG CAPS Take 1,000 mg by mouth daily.  . Red Yeast Rice 600 MG CAPS Take 1,200 mg by mouth 2 (two) times daily.   . Turmeric 500 MG CAPS Take 1,000 mg by mouth daily.      Allergies:   Codeine, Erythromycin, Fentanyl,  Sulfa antibiotics, and Tape   Social History   Tobacco Use  . Smoking status: Never Smoker  . Smokeless tobacco: Never Used  Substance Use Topics  . Alcohol use: No    Alcohol/week: 0.0 standard drinks  . Drug use: No     Family Hx: The patient's family history includes Cancer in her brother, maternal grandfather, mother, and paternal grandfather; Heart disease in her brother, father, and maternal grandfather; Hyperlipidemia in her mother; Hypertension in her maternal grandmother and mother; Stroke in her father, maternal grandmother, and paternal grandmother.  ROS:   Please see the history of present illness.    Pt denies any symptoms All other systems reviewed and are negative.   Prior CV studies:   The following studies were reviewed today:  Stress tess report discussed with the patient  Labs/Other Tests and Data Reviewed:    EKG:  Previous EKG reviewed.  Recent Labs: No results found for requested labs within last 8760 hours.   Recent Lipid Panel Lab Results  Component Value Date/Time   CHOL 198 02/20/2018 07:00 AM   TRIG 74 02/20/2018 07:00 AM   HDL 71 02/20/2018 07:00 AM   CHOLHDL 2.8 02/20/2018 07:00 AM   LDLCALC 112 (H) 02/20/2018 07:00 AM    Wt Readings from Last 3 Encounters:  09/01/20 106 lb (48.1 kg)  02/21/20 104 lb (47.2 kg)  02/17/20 104 lb 0.6 oz (47.2 kg)     Risk Assessment/Calculations:      Objective:    Vital Signs:  Ht 4\' 10"  (1.473 m)   Wt 106 lb (48.1 kg)   BMI 22.15 kg/m    VITAL SIGNS:  reviewed  ASSESSMENT & PLAN:    1. Mild aortic regurgitation: Medical management at this time.  I discussed this with the patient at length. 2. Dyspnea on exertion: Stress test was unremarkable.  The report was discussed with the patient at length and I reassured her.  She is walking half a mile a day on a regular basis without any problems. 3. Mixed dyslipidemia: Stable and very mild.  Diet #. 4. Patient will be seen in follow-up appointment  in 6 months or earlier if the patient has any concerns       COVID-19 Education: The signs and symptoms of COVID-19 were discussed with the patient and how to seek care for testing (follow up with PCP or arrange E-visit).  The importance of social distancing was discussed today.  Time:   Today, I have spent 15 minutes with the patient with telehealth technology discussing the above problems.     Medication Adjustments/Labs and Tests Ordered: Current medicines are reviewed at length with the patient today.  Concerns regarding medicines are outlined above.   Tests Ordered: No orders of the defined types were placed in this encounter.   Medication Changes: No orders of the  defined types were placed in this encounter.   Follow Up:  In Person in 6 month(s)  Signed, Jenean Lindau, MD  09/01/2020 9:25 AM    [place]

## 2020-09-01 NOTE — Patient Instructions (Signed)

## 2020-11-23 ENCOUNTER — Ambulatory Visit
Admission: RE | Admit: 2020-11-23 | Discharge: 2020-11-23 | Disposition: A | Discharge status: Home / Self Care | Payer: Medicare Other | Source: Ambulatory Visit | Attending: Internal Medicine | Admitting: Internal Medicine

## 2020-11-23 ENCOUNTER — Other Ambulatory Visit: Payer: Self-pay | Admitting: Internal Medicine

## 2020-11-23 ENCOUNTER — Other Ambulatory Visit: Payer: Self-pay

## 2020-11-23 DIAGNOSIS — R0781 Pleurodynia: Secondary | ICD-10-CM

## 2021-03-18 ENCOUNTER — Encounter: Payer: Self-pay | Admitting: Cardiology

## 2021-03-18 ENCOUNTER — Other Ambulatory Visit: Payer: Self-pay

## 2021-03-18 ENCOUNTER — Ambulatory Visit (INDEPENDENT_AMBULATORY_CARE_PROVIDER_SITE_OTHER): Payer: Medicare Other | Admitting: Cardiology

## 2021-03-18 VITALS — BP 106/58 | HR 75 | Ht <= 58 in | Wt 101.1 lb

## 2021-03-18 DIAGNOSIS — E782 Mixed hyperlipidemia: Secondary | ICD-10-CM | POA: Diagnosis not present

## 2021-03-18 DIAGNOSIS — I351 Nonrheumatic aortic (valve) insufficiency: Secondary | ICD-10-CM | POA: Diagnosis not present

## 2021-03-18 DIAGNOSIS — M810 Age-related osteoporosis without current pathological fracture: Secondary | ICD-10-CM

## 2021-03-18 DIAGNOSIS — Q963 Mosaicism, 45, X/46, XX or XY: Secondary | ICD-10-CM | POA: Diagnosis not present

## 2021-03-18 HISTORY — DX: Age-related osteoporosis without current pathological fracture: M81.0

## 2021-03-18 NOTE — Patient Instructions (Signed)
Medication Instructions:  No medication changes *If you need a refill on your cardiac medications before your next appointment, please call your pharmacy*   Lab Work: None ordered If you have labs (blood work) drawn today and your tests are completely normal, you will receive your results only by: . MyChart Message (if you have MyChart) OR . A paper copy in the mail If you have any lab test that is abnormal or we need to change your treatment, we will call you to review the results.   Testing/Procedures: None ordered   Follow-Up: At [facility], you and your health needs are our priority.  As part of our continuing mission to provide you with exceptional heart care, we have created designated Provider Care Teams.  These Care Teams include your primary Cardiologist (physician) and Advanced Practice Providers (APPs -  Physician Assistants and Nurse Practitioners) who all work together to provide you with the care you need, when you need it.  We recommend signing up for the patient portal called "MyChart".  Sign up information is provided on this After Visit Summary.  MyChart is used to connect with patients for Virtual Visits (Telemedicine).  Patients are able to view lab/test results, encounter notes, upcoming appointments, etc.  Non-urgent messages can be sent to your provider as well.   To learn more about what you can do with MyChart, go to [email or web address].    Your next appointment:   9 month(s)  The format for your next appointment:   In Person  Provider:   Rajan Revankar, MD   Other Instructions NA 

## 2021-03-18 NOTE — Progress Notes (Signed)
Cardiology Office Note:    Date:  03/18/2021   ID:  Tabitha Baker, DOB [birth year], MRN [id number]  PCP:  Johna Roles, PA  Cardiologist:  Jenean Lindau, MD   Referring MD: Johna Roles, PA    ASSESSMENT:    1. Mild aortic regurgitation   2. Turner syndrome with mosaicism   3. Mixed dyslipidemia    PLAN:    In order of problems listed above:  Primary prevention stressed with the patient.  Importance of compliance with diet medication stressed and she vocalized understanding. Mild aortic regurgitation: Stable at this time.  We will continue to monitor.  Echocardiogram will be done next year again her next visit. Mixed dyslipidemia: Diet was emphasized.  This is stable.  She does not have any issues and this is followed by primary care. Turner syndrome: Stable.  She had multiple questions which were answered to her satisfaction.   Medication Adjustments/Labs and Tests Ordered: Current medicines are reviewed at length with the patient today.  Concerns regarding medicines are outlined above.  Orders Placed This Encounter  Procedures   EKG 12-Lead   No orders of the defined types were placed in this encounter.    No chief complaint on file.    History of Present Illness:    Tabitha Baker is a 70 y.o. female.  Patient has past medical history of Turner syndrome.  Patient was evaluated by me for dyspnea on exertion.  Her echocardiogram and stress test was unremarkable.  She has mild aortic regurgitation.  She denies any problems at this time and takes care of activities of daily living.  No chest pain orthopnea or PND.  At the time of my evaluation, the patient is alert awake oriented and in no distress.  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[place])    Cancer ([place])    Cataract    DOE (dyspnea on exertion) 02/17/2020   Hearing loss    History of squamous cell carcinoma    Hyperlipidemia, unspecified    Hypothyroidism due to  medication    Mild aortic regurgitation    Mild tricuspid regurgitation    Mixed dyslipidemia 02/17/2020   Osteoporosis    Thyroid disease    Turner syndrome with mosaicism    UTI (urinary tract infection)     Past Surgical History:  Procedure Laterality Date   ABDOMINAL HYSTERECTOMY     APPENDECTOMY     EYE SURGERY     HERNIA REPAIR      Current Medications: Current Meds  Medication Sig   alendronate (FOSAMAX) 35 MG tablet Take 35 mg by mouth every 7 (seven) days. Take with a full glass of water on an empty stomach.   Calcium 600-200 MG-UNIT tablet Take 1 tablet by mouth daily.   cholecalciferol (VITAMIN D3) 25 MCG (1000 UNIT) tablet Take 1,000 Units by mouth daily.   Coenzyme Q10 (CO Q10) 200 MG CAPS Take 200 mg by mouth daily.   conjugated estrogens (PREMARIN) vaginal cream Place 1 Applicatorful vaginally once a week.   Cranberry-Vitamin C-Vitamin E (CRANBERRY PLUS VITAMIN C) 4200-20-3 MG-MG-UNIT CAPS Take 8,400 mg by mouth in the morning.   cycloSPORINE (RESTASIS) 0.05 % ophthalmic emulsion Place 1 drop into both eyes 2 (two) times daily.   ezetimibe (ZETIA) 10 MG tablet Take 10 mg by mouth daily.   famotidine (PEPCID) 20 MG tablet Take 20 mg by mouth at bedtime.   fexofenadine-pseudoephedrine (ALLEGRA-D 24) 180-240  MG 24 hr tablet Take 1 tablet by mouth daily.   FLUoxetine (PROZAC) 20 MG capsule Take 20 mg by mouth daily.   fluticasone (FLONASE) 50 MCG/ACT nasal spray Place 1 spray into both nostrils 2 (two) times daily.    levothyroxine (SYNTHROID) 75 MCG tablet Take 75 mcg by mouth daily before breakfast.   Lutein 20 MG CAPS Take 20 mg by mouth daily.   Omega-3 Fatty Acids (FISH OIL) 1000 MG CAPS Take 1,000 mg by mouth daily.   Red Yeast Rice 600 MG CAPS Take 1,200 mg by mouth 2 (two) times daily.    Turmeric 500 MG CAPS Take 1,000 mg by mouth daily.      Allergies:   Codeine, Erythromycin, Fentanyl, Sulfa antibiotics, and Tape   Social History   Socioeconomic  History   Marital status: Widowed    Spouse name: Not on file   Number of children: Not on file   Years of education: Not on file   Highest education level: Not on file  Occupational History   Not on file  Tobacco Use   Smoking status: Never   Smokeless tobacco: Never  Substance and Sexual Activity   Alcohol use: No    Alcohol/week: 0.0 standard drinks   Drug use: No   Sexual activity: Not on file  Other Topics Concern   Not on file  Social History Narrative   Not on file   Social Determinants of Health   Financial Resource Strain: Not on file  Food Insecurity: Not on file  Transportation Needs: Not on file  Physical Activity: Not on file  Stress: Not on file  Social Connections: Not on file     Family History: The patient's family history includes Cancer in her brother, maternal grandfather, mother, and paternal grandfather; Heart disease in her brother, father, and maternal grandfather; Hyperlipidemia in her mother; Hypertension in her maternal grandmother and mother; Stroke in her father, maternal grandmother, and paternal grandmother.  ROS:   Please see the history of present illness.    All other systems reviewed and are negative.  EKGs/Labs/Other Studies Reviewed:    The following studies were reviewed today: I discussed my findings with the patient at length.   Recent Labs: No results found for requested labs within last 8760 hours.  Recent Lipid Panel    Component Value Date/Time   CHOL 198 02/20/2018 0700   TRIG 74 02/20/2018 0700   HDL 71 02/20/2018 0700   CHOLHDL 2.8 02/20/2018 0700   VLDL 15 02/20/2018 0700   LDLCALC 112 (H) 02/20/2018 0700    Physical Exam:    VS:  BP (!) 106/58   Pulse 75   Ht 4\' 10"  (1.473 m)   Wt 101 lb 1.9 oz (45.9 kg)   SpO2 98%   BMI 21.13 kg/m     Wt Readings from Last 3 Encounters:  03/18/21 101 lb 1.9 oz (45.9 kg)  09/01/20 106 lb (48.1 kg)  02/21/20 104 lb (47.2 kg)     GEN: Patient is in no acute  distress HEENT: Normal NECK: No JVD; No carotid bruits LYMPHATICS: No lymphadenopathy CARDIAC: Hear sounds regular, 2/6 systolic murmur at the apex. RESPIRATORY:  Clear to auscultation without rales, wheezing or rhonchi  ABDOMEN: Soft, non-tender, non-distended MUSCULOSKELETAL:  No edema; No deformity  SKIN: Warm and dry NEUROLOGIC:  Alert and oriented x 3 PSYCHIATRIC:  Normal affect   Signed, Jenean Lindau, MD  03/18/2021 4:24 PM    [facility]

## 2021-07-05 ENCOUNTER — Other Ambulatory Visit: Payer: Self-pay | Admitting: Internal Medicine

## 2021-07-05 DIAGNOSIS — M8580 Other specified disorders of bone density and structure, unspecified site: Secondary | ICD-10-CM

## 2021-07-05 DIAGNOSIS — Z1231 Encounter for screening mammogram for malignant neoplasm of breast: Secondary | ICD-10-CM

## 2021-12-08 ENCOUNTER — Ambulatory Visit
Admission: RE | Admit: 2021-12-08 | Discharge: 2021-12-08 | Disposition: A | Discharge status: Home / Self Care | Payer: Medicare Other | Source: Ambulatory Visit | Attending: Internal Medicine | Admitting: Internal Medicine

## 2021-12-08 DIAGNOSIS — Z1231 Encounter for screening mammogram for malignant neoplasm of breast: Secondary | ICD-10-CM

## 2021-12-08 DIAGNOSIS — M8580 Other specified disorders of bone density and structure, unspecified site: Secondary | ICD-10-CM

## 2021-12-30 ENCOUNTER — Other Ambulatory Visit: Payer: Self-pay | Admitting: Internal Medicine

## 2021-12-30 DIAGNOSIS — R928 Other abnormal and inconclusive findings on diagnostic imaging of breast: Secondary | ICD-10-CM

## 2022-01-11 ENCOUNTER — Ambulatory Visit
Admission: RE | Admit: 2022-01-11 | Discharge: 2022-01-11 | Disposition: A | Discharge status: Home / Self Care | Payer: Medicare Other | Source: Ambulatory Visit | Attending: Internal Medicine | Admitting: Internal Medicine

## 2022-01-11 DIAGNOSIS — R928 Other abnormal and inconclusive findings on diagnostic imaging of breast: Secondary | ICD-10-CM

## 2022-02-21 DIAGNOSIS — C8590 Non-Hodgkin lymphoma, unspecified, unspecified site: Secondary | ICD-10-CM

## 2022-02-21 DIAGNOSIS — I781 Nevus, non-neoplastic: Secondary | ICD-10-CM

## 2022-02-21 DIAGNOSIS — F32A Depression, unspecified: Secondary | ICD-10-CM | POA: Insufficient documentation

## 2022-02-21 DIAGNOSIS — H04129 Dry eye syndrome of unspecified lacrimal gland: Secondary | ICD-10-CM | POA: Insufficient documentation

## 2022-02-21 DIAGNOSIS — D140 Benign neoplasm of middle ear, nasal cavity and accessory sinuses: Secondary | ICD-10-CM

## 2022-02-21 DIAGNOSIS — C859A Non-Hodgkin lymphoma, unspecified, in remission: Secondary | ICD-10-CM

## 2022-02-21 DIAGNOSIS — J329 Chronic sinusitis, unspecified: Secondary | ICD-10-CM | POA: Insufficient documentation

## 2022-02-21 HISTORY — DX: Benign neoplasm of middle ear, nasal cavity and accessory sinuses: D14.0

## 2022-02-21 HISTORY — DX: Chronic sinusitis, unspecified: J32.9

## 2022-02-21 HISTORY — DX: Non-Hodgkin lymphoma, unspecified, in remission: C85.9A

## 2022-02-21 HISTORY — DX: Depression, unspecified: F32.A

## 2022-02-21 HISTORY — DX: Nevus, non-neoplastic: I78.1

## 2022-02-25 ENCOUNTER — Other Ambulatory Visit: Payer: Self-pay | Admitting: Physician Assistant

## 2022-02-25 ENCOUNTER — Ambulatory Visit
Admission: RE | Admit: 2022-02-25 | Discharge: 2022-02-25 | Disposition: A | Discharge status: Home / Self Care | Payer: Medicare Other | Source: Ambulatory Visit | Attending: Physician Assistant | Admitting: Physician Assistant

## 2022-02-25 DIAGNOSIS — R051 Acute cough: Secondary | ICD-10-CM

## 2022-03-02 ENCOUNTER — Ambulatory Visit: Payer: Medicare Other | Attending: Cardiology | Admitting: Cardiology

## 2022-03-02 ENCOUNTER — Encounter: Payer: Self-pay | Admitting: Cardiology

## 2022-03-02 VITALS — BP 118/66 | HR 72 | Ht <= 58 in | Wt 104.0 lb

## 2022-03-02 DIAGNOSIS — E782 Mixed hyperlipidemia: Secondary | ICD-10-CM

## 2022-03-02 DIAGNOSIS — Q963 Mosaicism, 45, X/46, XX or XY: Secondary | ICD-10-CM

## 2022-03-02 DIAGNOSIS — I351 Nonrheumatic aortic (valve) insufficiency: Secondary | ICD-10-CM

## 2022-03-02 NOTE — Patient Instructions (Signed)
Medication Instructions:  Your physician recommends that you continue on your current medications as directed. Please refer to the Current Medication list given to you today.  *If you need a refill on your cardiac medications before your next appointment, please call your pharmacy*   Lab Work: None ordered If you have labs (blood work) drawn today and your tests are completely normal, you will receive your results only by: MyChart Message (if you have MyChart) OR A paper copy in the mail If you have any lab test that is abnormal or we need to change your treatment, we will call you to review the results.   Testing/Procedures: Your physician has requested that you have an echocardiogram. Echocardiography is a painless test that uses sound waves to create images of your heart. It provides your doctor with information about the size and shape of your heart and how well your heart's chambers and valves are working. This procedure takes approximately one hour. There are no restrictions for this procedure.    Follow-Up: At [facility], you and your health needs are our priority.  As part of our continuing mission to provide you with exceptional heart care, we have created designated Provider Care Teams.  These Care Teams include your primary Cardiologist (physician) and Advanced Practice Providers (APPs -  Physician Assistants and Nurse Practitioners) who all work together to provide you with the care you need, when you need it.  We recommend signing up for the patient portal called "MyChart".  Sign up information is provided on this After Visit Summary.  MyChart is used to connect with patients for Virtual Visits (Telemedicine).  Patients are able to view lab/test results, encounter notes, upcoming appointments, etc.  Non-urgent messages can be sent to your provider as well.   To learn more about what you can do with MyChart, go to [email or web address].    Your next appointment:   12  month(s)  The format for your next appointment:   In Person  Provider:   Rajan Revankar, MD   Other Instructions Echocardiogram An echocardiogram is a test that uses sound waves (ultrasound) to produce images of the heart. Images from an echocardiogram can provide important information about: Heart size and shape. The size and thickness and movement of your heart's walls. Heart muscle function and strength. Heart valve function or if you have stenosis. Stenosis is when the heart valves are too narrow. If blood is flowing backward through the heart valves (regurgitation). A tumor or infectious growth around the heart valves. Areas of heart muscle that are not working well because of poor blood flow or injury from a heart attack. Aneurysm detection. An aneurysm is a weak or damaged part of an artery wall. The wall bulges out from the normal force of blood pumping through the body. Tell a health care provider about: Any allergies you have. All medicines you are taking, including vitamins, herbs, eye drops, creams, and over-the-counter medicines. Any blood disorders you have. Any surgeries you have had. Any medical conditions you have. Whether you are pregnant or may be pregnant. What are the risks? Generally, this is a safe test. However, problems may occur, including an allergic reaction to dye (contrast) that may be used during the test. What happens before the test? No specific preparation is needed. You may eat and drink normally. What happens during the test? You will take off your clothes from the waist up and put on a hospital gown. Electrodes or electrocardiogram (ECG)patches may be placed on   your chest. The electrodes or patches are then connected to a device that monitors your heart rate and rhythm. You will lie down on a table for an ultrasound exam. A gel will be applied to your chest to help sound waves pass through your skin. A handheld device, called a transducer, will  be pressed against your chest and moved over your heart. The transducer produces sound waves that travel to your heart and bounce back (or "echo" back) to the transducer. These sound waves will be captured in real-time and changed into images of your heart that can be viewed on a video monitor. The images will be recorded on a computer and reviewed by your health care provider. You may be asked to change positions or hold your breath for a short time. This makes it easier to get different views or better views of your heart. In some cases, you may receive contrast through an IV in one of your veins. This can improve the quality of the pictures from your heart. The procedure may vary among health care providers and hospitals.   What can I expect after the test? You may return to your normal, everyday life, including diet, activities, and medicines, unless your health care provider tells you not to do that. Follow these instructions at home: It is up to you to get the results of your test. Ask your health care provider, or the department that is doing the test, when your results will be ready. Keep all follow-up visits. This is important. Summary An echocardiogram is a test that uses sound waves (ultrasound) to produce images of the heart. Images from an echocardiogram can provide important information about the size and shape of your heart, heart muscle function, heart valve function, and other possible heart problems. You do not need to do anything to prepare before this test. You may eat and drink normally. After the echocardiogram is completed, you may return to your normal, everyday life, unless your health care provider tells you not to do that. This information is not intended to replace advice given to you by your health care provider. Make sure you discuss any questions you have with your health care provider. Document Revised: 12/10/2019 Document Reviewed: 12/10/2019 Elsevier Patient  Education  2021 [employer]   

## 2022-03-02 NOTE — Progress Notes (Signed)
Cardiology Office Note:    Date:  03/02/2022   ID:  Tabitha Baker, DOB [birth year], MRN [id number]  PCP:  Johna Roles, PA  Cardiologist:  Jenean Lindau, MD   Referring MD: Johna Roles, PA    ASSESSMENT:    1. Mild aortic regurgitation   2. Turner syndrome with mosaicism   3. Mixed hyperlipidemia    PLAN:    In order of problems listed above:  Primary prevention stressed to the patient.  Importance of compliance with diet medication stressed and she vocalized understanding.  She was advised to walk at least half an hour a day 5 days a week and she promises to do so. History of Turner syndrome and mild aortic regurgitation.  We will do an echocardiogram to follow this.  It will also help assess aortic root size. Lipidemia: On lipid-lowering medications followed by primary care.  Lipids are elevated.  I discussed this with her at length.  Diet emphasized.  Lifestyle modification urged and she promises to do better. Patient will be seen in follow-up appointment in 12 months or earlier if the patient has any concerns    Medication Adjustments/Labs and Tests Ordered: Current medicines are reviewed at length with the patient today.  Concerns regarding medicines are outlined above.  Orders Placed This Encounter  Procedures   ECHOCARDIOGRAM COMPLETE   No orders of the defined types were placed in this encounter.    No chief complaint on file.    History of Present Illness:    Tabitha Baker is a 71 y.o. female.  Patient has past medical history of Turner syndrome, mild aortic regurgitation and mixed dyslipidemia.  She denies any problems at this time and takes care of activities of daily living.  No chest pain orthopnea or PND.  At the time of my evaluation, the patient is alert awake oriented and in no distress.  Past Medical History:  Diagnosis Date   Age-related osteoporosis without current pathological fracture 03/18/2021   Allergy    Aortic valve  disorder 09/01/2020   B-cell lymphoma (HCC)    Benign neoplasm of nasal cavity, middle ear, 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/21/2022   Her nose appears irritated.   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   Hardening of the aorta (main artery of the heart) ([place]) 09/01/2020   Hearing loss    History of squamous cell carcinoma    Hyperlipidemia    Hypothyroidism    Lymphoma in remission ([place]) 02/21/2022   pt with hx of non hodgkins lymphoma needs f/u with dr Vergia Alberts   Mild aortic regurgitation    Mild tricuspid regurgitation    Mixed dyslipidemia 02/17/2020   Non-neoplastic nevus 02/21/2022   Osteoporosis    Pulmonic valve regurgitation 09/01/2020   Sinusitis 02/21/2022   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 Take with a full glass of water on an empty stomach.   ARIPiprazole (ABILIFY)  2 MG tablet Take 2-4 mg by mouth every morning.   Calcium 600-200 MG-UNIT tablet Take 1 tablet by mouth daily.   cholecalciferol (VITAMIN D3) 25 MCG (1000 UNIT) tablet Take 1,000 Units by mouth daily.   Coenzyme Q10 (CO Q10) 200 MG CAPS Take 200 mg by mouth daily.   conjugated estrogens (PREMARIN) vaginal cream Place 1 Applicatorful vaginally once a week.   Cranberry-Vitamin C-Vitamin E (CRANBERRY PLUS VITAMIN C) 4200-20-3 MG-MG-UNIT CAPS Take 8,400 mg by mouth in the morning.   cycloSPORINE (RESTASIS) 0.05 % ophthalmic emulsion Place 1 drop into both eyes 2 (two) times daily.   ezetimibe (ZETIA) 10 MG tablet Take 10 mg by mouth daily.    famotidine (PEPCID) 20 MG tablet Take 20 mg by mouth at bedtime.   fexofenadine-pseudoephedrine (ALLEGRA-D 24) 180-240 MG 24 hr tablet Take 1 tablet by mouth daily.   FLUoxetine (PROZAC) 40 MG capsule Take 40 mg by mouth daily.   fluticasone (FLONASE) 50 MCG/ACT nasal spray Place 1 spray into both nostrils 2 (two) times daily.    levothyroxine (SYNTHROID) 75 MCG tablet Take 75 mcg by mouth daily before breakfast.   Lutein 20 MG CAPS Take 20 mg by mouth daily.   Omega-3 Fatty Acids (FISH OIL) 1000 MG CAPS Take 1,000 mg by mouth daily.   Red Yeast Rice 600 MG CAPS Take 1,200 mg by mouth 2 (two) times daily.    rosuvastatin (CRESTOR) 5 MG tablet Take 5 mg by mouth 3 (three) times a week.   Turmeric 500 MG CAPS Take 1,000 mg by mouth daily.      Allergies:   Codeine, Erythromycin, Fentanyl, Pegfilgrastim, Sulfa antibiotics, and Tape   Social History   Socioeconomic History   Marital status: Widowed    Spouse name: Not on file   Number of children: Not on file   Years of education: Not on file   Highest education level: Not on file  Occupational History   Not on file  Tobacco Use   Smoking status: Never   Smokeless tobacco: Never  Substance and Sexual Activity   Alcohol use: No    Alcohol/week: 0.0 standard drinks of alcohol   Drug use: No   Sexual activity: Not on file  Other Topics Concern   Not on file  Social History Narrative   Not on file   Social Determinants of Health   Financial Resource Strain: Not on file  Food Insecurity: Not on file  Transportation Needs: Not on file  Physical Activity: Not on file  Stress: Not on file  Social Connections: Not on file     Family History: The patient's family history includes Cancer in her brother, maternal grandfather, mother, and paternal grandfather; Heart disease in her brother, father, and maternal grandfather; Hyperlipidemia in her mother; Hypertension in her maternal grandmother and mother; Stroke in her father, maternal  grandmother, and paternal grandmother.  ROS:   Please see the history of present illness.    All other systems reviewed and are negative.  EKGs/Labs/Other Studies Reviewed:    The following studies were reviewed today: I discussed my findings with the patient at length.   Recent Labs: No results found for requested labs within last 365 days.  Recent Lipid Panel    Component Value Date/Time   CHOL 198 02/20/2018 0700   TRIG 74 02/20/2018 0700   HDL 71 02/20/2018 0700   CHOLHDL 2.8 02/20/2018 0700   VLDL 15 02/20/2018 0700   LDLCALC 112 (H) 02/20/2018 0700  Physical Exam:    VS:  BP 118/66   Pulse 72   Ht '4\' 10"'$  (1.473 m)   Wt 104 lb (47.2 kg)   SpO2 98%   BMI 21.74 kg/m     Wt Readings from Last 3 Encounters:  03/02/22 104 lb (47.2 kg)  03/18/21 101 lb 1.9 oz (45.9 kg)  09/01/20 106 lb (48.1 kg)     GEN: Patient is in no acute distress HEENT: Normal NECK: No JVD; No carotid bruits LYMPHATICS: No lymphadenopathy CARDIAC: Hear sounds regular, 2/6 systolic murmur at the apex. RESPIRATORY:  Clear to auscultation without rales, wheezing or rhonchi  ABDOMEN: Soft, non-tender, non-distended MUSCULOSKELETAL:  No edema; No deformity  SKIN: Warm and dry NEUROLOGIC:  Alert and oriented x 3 PSYCHIATRIC:  Normal affect   Signed, Jenean Lindau, MD  03/02/2022 1:44 PM    [facility]

## 2022-03-02 NOTE — Addendum Note (Signed)
Addended by: Truddie Hidden on: 03/02/2022 01:47 PM   Modules accepted: Orders

## 2022-03-22 ENCOUNTER — Other Ambulatory Visit: Payer: Self-pay | Admitting: Physician Assistant

## 2022-03-22 ENCOUNTER — Ambulatory Visit
Admission: RE | Admit: 2022-03-22 | Discharge: 2022-03-22 | Disposition: A | Discharge status: Home / Self Care | Payer: Medicare Other | Source: Ambulatory Visit | Attending: Physician Assistant | Admitting: Physician Assistant

## 2022-03-22 DIAGNOSIS — Z8701 Personal history of pneumonia (recurrent): Secondary | ICD-10-CM

## 2022-03-23 ENCOUNTER — Ambulatory Visit (HOSPITAL_COMMUNITY): Payer: Medicare Other | Attending: Cardiology

## 2022-03-23 DIAGNOSIS — I351 Nonrheumatic aortic (valve) insufficiency: Secondary | ICD-10-CM | POA: Insufficient documentation

## 2022-03-23 DIAGNOSIS — Q963 Mosaicism, 45, X/46, XX or XY: Secondary | ICD-10-CM | POA: Insufficient documentation

## 2022-03-23 LAB — ECHOCARDIOGRAM COMPLETE
Area-P 1/2: 3.5 cm2
P 1/2 time: 554 msec
S' Lateral: 2.5 cm

## 2022-03-28 ENCOUNTER — Telehealth: Payer: Self-pay

## 2022-03-28 NOTE — Telephone Encounter (Signed)
-----   Message from Jenean Lindau, MD sent at 03/28/2022 11:50 AM EST ----- Mild to moderate aortic regurgitation.  Normal ejection fraction.  Mildly dilated ascending aorta.  We will monitor.  Copy primary care. Jenean Lindau, MD 03/28/2022 11:49 AM

## 2022-03-30 NOTE — Telephone Encounter (Signed)
Results reviewed with pt as per Dr. Revankar's note.  Pt verbalized understanding and had no additional questions. Routed to PCP.  

## 2022-03-30 NOTE — Telephone Encounter (Signed)
Pt is returning call. Transferred to [place], Therapist, sports.

## 2022-06-02 ENCOUNTER — Ambulatory Visit
Admission: RE | Admit: 2022-06-02 | Discharge: 2022-06-02 | Disposition: A | Discharge status: Home / Self Care | Payer: Medicare Other | Source: Ambulatory Visit | Attending: Physician Assistant | Admitting: Physician Assistant

## 2022-06-02 ENCOUNTER — Other Ambulatory Visit: Payer: Self-pay | Admitting: Physician Assistant

## 2022-06-02 DIAGNOSIS — M25561 Pain in right knee: Secondary | ICD-10-CM
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
## 2023-01-19 ENCOUNTER — Other Ambulatory Visit: Payer: Self-pay | Admitting: Physician Assistant

## 2023-01-19 DIAGNOSIS — Z1231 Encounter for screening mammogram for malignant neoplasm of breast: Secondary | ICD-10-CM

## 2023-02-07 ENCOUNTER — Ambulatory Visit
Admission: RE | Admit: 2023-02-07 | Discharge: 2023-02-07 | Disposition: A | Discharge status: Home / Self Care | Payer: Medicare Other | Source: Ambulatory Visit | Attending: Physician Assistant

## 2023-02-07 DIAGNOSIS — Z1231 Encounter for screening mammogram for malignant neoplasm of breast: Secondary | ICD-10-CM

## 2023-02-16 NOTE — Therapy (Signed)
OUTPATIENT PHYSICAL THERAPY LOWER EXTREMITY EVALUATION   Patient Name: Tabitha Baker MRN: [id number] DOB:October 19, 1950, 72 y.o., female Today's Date: 02/17/2023  END OF SESSION:  PT End of Session - 02/17/23 1059     Visit Number 1    Date for PT Re-Evaluation 04/14/23    Authorization Type MCR    Progress Note Due on Visit 10    PT Start Time 1059    PT Stop Time 1148    PT Time Calculation (min) 49 min    Activity Tolerance Patient tolerated treatment well    Behavior During Therapy WFL for tasks assessed/performed             Past Medical History:  Diagnosis Date   Age-related osteoporosis without current pathological fracture 03/18/2021   Allergy    Aortic valve disorder 09/01/2020   B-cell lymphoma (HCC)    Benign neoplasm of nasal cavity, middle ear, 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/21/2022   Her nose appears irritated.   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   Hardening of the aorta (main artery of the heart) (HCC) 09/01/2020   Hearing loss    History of squamous cell carcinoma    Hyperlipidemia    Hypothyroidism    Lymphoma in remission 02/21/2022   pt with hx of non hodgkins lymphoma needs f/u with dr Oda Cogan   Mild aortic regurgitation    Mild tricuspid regurgitation    Mixed dyslipidemia 02/17/2020   Non-neoplastic nevus 02/21/2022   Osteoporosis    Pulmonic valve regurgitation 09/01/2020   Sinusitis 02/21/2022   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    UTI (urinary tract infection)    Past Surgical History:  Procedure Laterality Date   ABDOMINAL HYSTERECTOMY     APPENDECTOMY     EYE SURGERY     HERNIA REPAIR     Patient Active Problem List   Diagnosis Date Noted   Benign neoplasm of nasal cavity, middle ear, 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/21/2022   Depression 02/21/2022   Dry eye syndrome  02/21/2022   Lymphoma in remission 02/21/2022   Non-neoplastic nevus 02/21/2022   Sinusitis 02/21/2022   Age-related osteoporosis without current pathological fracture 03/18/2021   Aortic valve disorder 09/01/2020   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9 (ICD-10-CM) - Unspecified abnormalities of gait and mobility  THERAPY DIAG:  Unsteadiness on feet  Other abnormalities of gait and mobility  Rationale for Evaluation and Treatment: Rehabilitation  ONSET DATE: recent months  SUBJECTIVE:   SUBJECTIVE STATEMENT: I'm clumsy. Balance is bad and has been for 5-6 years,  Worsening lately.  I tripped going up steps. Sometimes my shoes catch on the carpet. Does Silver Sneakers. Can't get off floor easily.  PERTINENT HISTORY:  Osteopenia, lymphoma remission, turner syndrome (poor spatial/hearing issues) PAIN:  Are you having pain? No  PRECAUTIONS: Fall  RED FLAGS: None   WEIGHT BEARING RESTRICTIONS: No  FALLS:  Has patient fallen in last 6 months? Yes. Number of falls 2  LIVING ENVIRONMENT: Lives with: lives with their son Lives in: House/apartment Stairs: Yes: Internal: 16 steps; on right going up and External: 1 steps; 2 inch step Has following equipment at home:  walking stick  OCCUPATION: retired  PLOF:  Independent  PATIENT GOALS: Get my balance better, safe with walking  NEXT MD VISIT: none scheduled  OBJECTIVE:  Note: Objective measures were completed at Evaluation unless otherwise noted.  DIAGNOSTIC FINDINGS: none  PATIENT SURVEYS:  ABC scale  1270 / 1600 = 79.4 %  COGNITION: Overall cognitive status: Within functional limits for tasks assessed     SENSATION: WFL   MUSCLE LENGTH: Tight B piriformis  POSTURE: flexed trunk   LOWER EXTREMITY ROM: WNL for task assessed  LOWER EXTREMITY MMT:  MMT Right eval Left eval  Hip flexion 4 4+  Hip extension 5 5  Hip abduction 5 5  Hip adduction    Hip internal rotation    Hip external rotation    Knee flexion 4- 5  Knee extension 5 5  Ankle dorsiflexion 5 5  Ankle plantarflexion    Ankle inversion    Ankle eversion     (Blank rows = not tested)  FUNCTIONAL TESTS:  Berg Balance Scale: 50/56 Dynamic Gait Index: 20/24  GAIT: Distance walked: 40 Assistive device utilized: None Level of assistance: Complete Independence Comments: deviates from straight path, quick cadence, wide BOS   TODAY'S TREATMENT:                                                                                                                              DATE:   02/17/23 See pt ed and HEP   PATIENT EDUCATION:  Education details: PT eval findings, anticipated POC, and initial HEP  Person educated: Patient Education method: Explanation, Demonstration, and Handouts Education comprehension: verbalized understanding and returned demonstration  HOME EXERCISE PROGRAM: Access Code: [email or web address] URL: [email or web address] Date: 02/17/2023 Prepared by: Raynelle Fanning  Exercises - Standing Romberg to 1/4 Tandem Stance  - 1 x daily - 3-4 x weekly - 1 sets - 10 reps - Standing Single Leg Stance with Unilateral Counter Support  - 2 x daily - 7 x weekly - 1 sets - 10 reps - as long as you can hold  ASSESSMENT:  CLINICAL IMPRESSION: Patient  is a 72 y.o. female who was seen today for physical therapy evaluation and treatment for unsteadiness and falls.She scored 50/56 on the BERG Balance Assessment indicating she is at moderate risk for falls. Greatest deficits are with narrow base of support activities and  looking down. She will benefit from skilled PT to address these deficits.  .   OBJECTIVE IMPAIRMENTS: Abnormal gait, decreased balance, decreased strength, impaired flexibility, and postural dysfunction.   ACTIVITY LIMITATIONS: bending, stairs, transfers, and locomotion level  PARTICIPATION LIMITATIONS: shopping and community activity  PERSONAL FACTORS: Age are also affecting patient's functional outcome.   REHAB POTENTIAL: Excellent  CLINICAL DECISION MAKING: Stable/uncomplicated  EVALUATION COMPLEXITY: Low   GOALS: Goals reviewed with patient? Yes  SHORT TERM GOALS: Target date: 03/17/2023   Patient will be independent with initial HEP. Baseline:  Goal status: INITIAL  2.   Patient will be educated on strategies to decrease risk of falls.  Baseline:  Goal status: INITIAL   LONG TERM GOALS: Target date: 04/14/2023  Patient will be independent with advanced/ongoing HEP to improve outcomes and carryover.  Baseline:  Goal status: INITIAL  2.   Patient will score 54/56 on Berg Balance test to demonstrate lower risk of falls.   Baseline: 50/56 Goal status: INITIAL  3.  Patient will be able to step up/down curb safely with LRAD for safety with community ambulation.  Baseline:  Goal status: INITIAL   4.  Patient will demonstrate improved functional LE strength as demonstrated by ability to perform floor to stand transfer. Baseline:  Goal status: INITIAL  5.  Patient will  demonstrate improved safety with gait by reporting decreased incidence of catching her feet or tripping by 75% or more. Baseline:  Goal status: INITIAL  6.  Patient to report no falls. Baseline:  Goal status:  INITIAL     PLAN:  PT FREQUENCY: 2x/week  PT DURATION: 8 weeks  PLANNED INTERVENTIONS: 97110-Therapeutic exercises, 97530- Therapeutic activity, [id number]- Neuromuscular re-education, 97535- Self Care, [id number]- Manual therapy, [phone]- Gait training, [phone]- Aquatic Therapy, Patient/Family education, Balance training, Stair training, Vestibular training, and Visual/preceptual remediation/compensation  PLAN FOR NEXT SESSION: work on higher level balance and gait activities; narrow BOS, may benefit from vestibular assessment.   Solon Palm, PT  02/17/2023, 12:04 PM

## 2023-02-17 ENCOUNTER — Encounter: Payer: Self-pay | Admitting: Physical Therapy

## 2023-02-17 ENCOUNTER — Ambulatory Visit: Payer: Medicare Other | Attending: Physician Assistant | Admitting: Physical Therapy

## 2023-02-17 ENCOUNTER — Other Ambulatory Visit: Payer: Self-pay

## 2023-02-17 DIAGNOSIS — R2689 Other abnormalities of gait and mobility: Secondary | ICD-10-CM | POA: Insufficient documentation

## 2023-02-17 DIAGNOSIS — R2681 Unsteadiness on feet: Secondary | ICD-10-CM | POA: Insufficient documentation

## 2023-02-23 NOTE — Therapy (Signed)
OUTPATIENT PHYSICAL THERAPY LOWER EXTREMITY TREATMENT   Patient Name: MALY ERVINE MRN: [id number] DOB:March 29, 1951, 72 y.o., female Today's Date: 02/24/2023  END OF SESSION:  PT End of Session - 02/24/23 0930     Visit Number 2    Date for PT Re-Evaluation 04/14/23    Authorization Type MCR    Progress Note Due on Visit 10    PT Start Time 0930    PT Stop Time 1012    PT Time Calculation (min) 42 min    Activity Tolerance Patient tolerated treatment well    Behavior During Therapy [facility] for tasks assessed/performed              Past Medical History:  Diagnosis Date   Age-related osteoporosis without current pathological fracture 03/18/2021   Allergy    Aortic valve disorder 09/01/2020   B-cell lymphoma (HCC)    Benign neoplasm of nasal cavity, middle ear, 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/21/2022   Her nose appears irritated.   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   Hardening of the aorta (main artery of the heart) (HCC) 09/01/2020   Hearing loss    History of squamous cell carcinoma    Hyperlipidemia    Hypothyroidism    Lymphoma in remission 02/21/2022   pt with hx of non hodgkins lymphoma needs f/u with dr Oda Cogan   Mild aortic regurgitation    Mild tricuspid regurgitation    Mixed dyslipidemia 02/17/2020   Non-neoplastic nevus 02/21/2022   Osteoporosis    Pulmonic valve regurgitation 09/01/2020   Sinusitis 02/21/2022   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    UTI (urinary tract infection)    Past Surgical History:  Procedure Laterality Date   ABDOMINAL HYSTERECTOMY     APPENDECTOMY     EYE SURGERY     HERNIA REPAIR     Patient Active Problem List   Diagnosis Date Noted   Benign neoplasm of nasal cavity, middle ear, 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/21/2022   Depression 02/21/2022   Dry eye syndrome  02/21/2022   Lymphoma in remission 02/21/2022   Non-neoplastic nevus 02/21/2022   Sinusitis 02/21/2022   Age-related osteoporosis without current pathological fracture 03/18/2021   Aortic valve disorder 09/01/2020   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9 (ICD-10-CM) - Unspecified abnormalities of gait and mobility  THERAPY DIAG:  Unsteadiness on feet  Other abnormalities of gait and mobility  Rationale for Evaluation and Treatment: Rehabilitation  ONSET DATE: recent months  SUBJECTIVE:   SUBJECTIVE STATEMENT: I'm feeling good.  Eval: I'm clumsy. Balance is bad  and has been for 5-6 years, Worsening lately.  I tripped going up steps. Sometimes my shoes catch on the carpet. Does Silver Sneakers. Can't get off floor easily.  PERTINENT HISTORY:  Osteopenia, lymphoma remission, turner syndrome (poor spatial/hearing issues) PAIN:  Are you having pain? No  PRECAUTIONS: Fall  RED FLAGS: None   WEIGHT BEARING RESTRICTIONS: No  FALLS:  Has patient fallen in last 6 months? Yes. Number of falls 2  LIVING ENVIRONMENT: Lives with: lives with their son Lives in: House/apartment Stairs: Yes: Internal: 16 steps; on right going up and External: 1 steps; 2 inch step Has following equipment at home:  walking stick  OCCUPATION:  retired  PLOF: Independent  PATIENT GOALS: Get my balance better, safe with walking  NEXT MD VISIT: none scheduled  OBJECTIVE:  Note: Objective measures were completed at Evaluation unless otherwise noted.  DIAGNOSTIC FINDINGS: none  PATIENT SURVEYS:  ABC scale  1270 / 1600 = 79.4 %  COGNITION: Overall cognitive status: Within functional limits for tasks assessed     SENSATION: WFL   MUSCLE LENGTH: Tight B piriformis  POSTURE: flexed trunk   LOWER EXTREMITY ROM: WNL for task assessed  LOWER EXTREMITY MMT:  MMT Right eval Left eval  Hip flexion 4 4+  Hip extension 5 5  Hip abduction 5 5  Hip adduction    Hip internal rotation    Hip external rotation    Knee flexion 4- 5  Knee extension 5 5  Ankle dorsiflexion 5 5  Ankle plantarflexion    Ankle inversion    Ankle eversion     (Blank rows = not tested)  FUNCTIONAL TESTS:  Berg Balance Scale: 50/56 Dynamic Gait Index: 20/24 10/25: SLS R 3 sec, L 7 sec  GAIT: Distance walked: 40 Assistive device utilized: None Level of assistance: Complete Independence Comments: deviates from straight path, quick cadence, wide BOS   TODAY'S TREATMENT:                                                                                                                              DATE:   02/24/23 All activities done in Parallel Bars with SBA to CGA:  Fwd and lateral walking over agility bars - cues to slow down Bosu: Balance, lateral step ups x 10 B, fwd step ups x 10 B SLS Single leg star taps x 10 B Cone taps (3) x 10 B Rhomberg partial B, then tandem stance B Tandem walking fwd and bwd x 2 laps ea   02/17/23 See pt ed and HEP   PATIENT EDUCATION:  Education details: PT eval findings, anticipated POC, and initial HEP  Person educated: Patient Education method: Explanation, Demonstration, and Handouts Education comprehension: verbalized understanding and returned demonstration  HOME EXERCISE PROGRAM: Access  Code: [email or web address] URL: [email or web address] Date: 02/24/2023 Prepared by: Raynelle Fanning  Exercises - Standing Romberg to 1/4 Tandem Stance  - 1 x daily - 3-4 x weekly - 1 sets -  10 reps - Standing Single Leg Stance with Unilateral Counter Support  - 2 x daily - 7 x weekly - 1 sets - 10 reps - as long as you can hold - Tandem Stance in Corner  - 1 x daily - 3-4 x weekly - 1 sets - 10 reps - Star Taps  - 1 x daily - 3-4 x weekly - 1 sets - 10 reps  ASSESSMENT:  CLINICAL IMPRESSION: Tabitha Baker did very well with her first f/u visit. She was challenged by all balance activities and improved with reps. She demonstrates functional gluteus med weakness R>L.  Needs some cueing to slow down at times. A little impulsive with agility walks.   OBJECTIVE IMPAIRMENTS: Abnormal gait, decreased balance, decreased strength, impaired flexibility, and postural dysfunction.   ACTIVITY LIMITATIONS: bending, stairs, transfers, and locomotion level  PARTICIPATION LIMITATIONS: shopping and community activity  PERSONAL FACTORS: Age are also affecting patient's functional outcome.   REHAB POTENTIAL: Excellent  CLINICAL DECISION MAKING: Stable/uncomplicated  EVALUATION COMPLEXITY: Low   GOALS: Goals reviewed with patient? Yes  SHORT TERM GOALS: Target date: 03/17/2023   Patient will be independent with initial HEP. Baseline:  Goal status: INITIAL  2.   Patient will be educated on strategies to decrease risk of falls.  Baseline:  Goal status: INITIAL   LONG TERM GOALS: Target date: 04/14/2023  Patient will be independent with advanced/ongoing HEP to improve outcomes and carryover.  Baseline:  Goal status: INITIAL  2.   Patient will score 54/56 on Berg Balance test to demonstrate lower risk of falls.   Baseline: 50/56 Goal status: INITIAL  3.  Patient will be able to step up/down curb safely with LRAD for safety with community ambulation.  Baseline:  Goal status: INITIAL   4.  Patient  will demonstrate improved functional LE strength as demonstrated by ability to perform floor to stand transfer. Baseline:  Goal status: INITIAL  5.  Patient will  demonstrate improved safety with gait by reporting decreased incidence of catching her feet or tripping by 75% or more. Baseline:  Goal status: INITIAL  6.  Patient to report no falls. Baseline:  Goal status: INITIAL     PLAN:  PT FREQUENCY: 2x/week  PT DURATION: 8 weeks  PLANNED INTERVENTIONS: 97110-Therapeutic exercises, 97530- Therapeutic activity, [id number]- Neuromuscular re-education, 97535- Self Care, [id number]- Manual therapy, [phone]- Gait training, [phone]- Aquatic Therapy, Patient/Family education, Balance training, Stair training, Vestibular training, and Visual/preceptual remediation/compensation  PLAN FOR NEXT SESSION: work on higher level balance and gait activities; narrow BOS, may benefit from vestibular assessment.   Solon Palm, PT  02/24/2023, 10:22 AM

## 2023-02-24 ENCOUNTER — Ambulatory Visit: Payer: Medicare Other | Admitting: Physical Therapy

## 2023-02-24 ENCOUNTER — Encounter: Payer: Self-pay | Admitting: Physical Therapy

## 2023-02-24 DIAGNOSIS — R2681 Unsteadiness on feet: Secondary | ICD-10-CM

## 2023-02-24 DIAGNOSIS — R2689 Other abnormalities of gait and mobility: Secondary | ICD-10-CM

## 2023-03-03 ENCOUNTER — Ambulatory Visit: Payer: Medicare Other | Attending: Physician Assistant | Admitting: Rehabilitative and Restorative Service Providers"

## 2023-03-03 ENCOUNTER — Encounter: Payer: Self-pay | Admitting: Rehabilitative and Restorative Service Providers"

## 2023-03-03 DIAGNOSIS — R2681 Unsteadiness on feet: Secondary | ICD-10-CM | POA: Diagnosis present

## 2023-03-03 DIAGNOSIS — R2689 Other abnormalities of gait and mobility: Secondary | ICD-10-CM | POA: Diagnosis present

## 2023-03-03 NOTE — Therapy (Signed)
OUTPATIENT PHYSICAL THERAPY LOWER EXTREMITY TREATMENT   Patient Name: Tabitha Baker MRN: [id number] DOB:08-Oct-1950, 72 y.o., female Today's Date: 03/03/2023  END OF SESSION:  PT End of Session - 03/03/23 1104     Visit Number 3    Date for PT Re-Evaluation 04/14/23    Authorization Type MCR    Progress Note Due on Visit 10    PT Start Time 1101    PT Stop Time 1140    PT Time Calculation (min) 39 min    Activity Tolerance Patient tolerated treatment well    Behavior During Therapy WFL for tasks assessed/performed              Past Medical History:  Diagnosis Date   Age-related osteoporosis without current pathological fracture 03/18/2021   Allergy    Aortic valve disorder 09/01/2020   B-cell lymphoma (HCC)    Benign neoplasm of nasal cavity, middle ear, 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/21/2022   Her nose appears irritated.   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   Hardening of the aorta (main artery of the heart) (HCC) 09/01/2020   Hearing loss    History of squamous cell carcinoma    Hyperlipidemia    Hypothyroidism    Lymphoma in remission 02/21/2022   pt with hx of non hodgkins lymphoma needs f/u with dr Oda Cogan   Mild aortic regurgitation    Mild tricuspid regurgitation    Mixed dyslipidemia 02/17/2020   Non-neoplastic nevus 02/21/2022   Osteoporosis    Pulmonic valve regurgitation 09/01/2020   Sinusitis 02/21/2022   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    UTI (urinary tract infection)    Past Surgical History:  Procedure Laterality Date   ABDOMINAL HYSTERECTOMY     APPENDECTOMY     EYE SURGERY     HERNIA REPAIR     Patient Active Problem List   Diagnosis Date Noted   Benign neoplasm of nasal cavity, middle ear, 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/21/2022   Depression 02/21/2022   Dry eye syndrome  02/21/2022   Lymphoma in remission 02/21/2022   Non-neoplastic nevus 02/21/2022   Sinusitis 02/21/2022   Age-related osteoporosis without current pathological fracture 03/18/2021   Aortic valve disorder 09/01/2020   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9 (ICD-10-CM) - Unspecified abnormalities of gait and mobility  THERAPY DIAG:  Unsteadiness on feet  Other abnormalities of gait and mobility  Rationale for Evaluation and Treatment: Rehabilitation  ONSET DATE: recent months  SUBJECTIVE:   SUBJECTIVE STATEMENT: Pt reports that she did Silver Sneakers this morning.  States that her knees are aching a little bit today.  PERTINENT HISTORY:  Osteopenia, lymphoma remission, turner syndrome (poor spatial/hearing issues) PAIN:  Are you having pain? Yes: NPRS scale: 3/10 Pain location: bilateral knees Pain description: aching Aggravating factors: increased movement Relieving factors: rest  PRECAUTIONS: Fall  RED FLAGS: None   WEIGHT BEARING RESTRICTIONS: No  FALLS:  Has patient fallen in last 6 months? Yes. Number of falls 2  LIVING ENVIRONMENT: Lives with: lives with their son Lives in: House/apartment Stairs: Yes: Internal: 16 steps; on right going up and External: 1 steps; 2 inch step Has following equipment at home:   walking stick  OCCUPATION: retired  PLOF: Independent  PATIENT GOALS: Get my balance better, safe with walking  NEXT MD VISIT: none scheduled  OBJECTIVE:  Note: Objective measures were completed at Evaluation unless otherwise noted.  DIAGNOSTIC FINDINGS: none  PATIENT SURVEYS:  Eval:  ABC scale  1270 / 1600 = 79.4 %  COGNITION: Overall cognitive status: Within functional limits for tasks assessed     SENSATION: WFL   MUSCLE LENGTH: Tight B piriformis  POSTURE: flexed trunk   LOWER EXTREMITY ROM: WNL for task assessed  LOWER EXTREMITY MMT:  MMT Right eval Left eval  Hip flexion 4 4+  Hip extension 5 5  Hip abduction 5 5  Hip adduction    Hip internal rotation    Hip external rotation    Knee flexion 4- 5  Knee extension 5 5  Ankle dorsiflexion 5 5  Ankle plantarflexion    Ankle inversion    Ankle eversion     (Blank rows = not tested)  FUNCTIONAL TESTS:  Berg Balance Scale: 50/56 Dynamic Gait Index: 20/24 10/25: SLS R 3 sec, L 7 sec  GAIT: Distance walked: 40 Assistive device utilized: None Level of assistance: Complete Independence Comments: deviates from straight path, quick cadence, wide BOS   TODAY'S TREATMENT:                                                                                                                              DATE:   03/03/2023 Nustep level 5 x5 min with PT present to discuss status Standing alt toe tap to 2 cones 2x10 with CGA from PT Seated hamstring stretch x20 sec bilat Seated piriformis stretch x20 sec bilat (more difficulty with right leg) Standing in kickstand position performing touching to cone on PT mat in front of patient x10 bilat 4 square steps to colored dots on floor:  x5 each direction Standing on foam pad at barre:  hip abduction, hip extension, marching.  2x10 each bilat (with cuing for technique, body mechanics/posture)   02/24/23 All activities done in Parallel Bars with SBA to CGA:  Fwd and  lateral walking over agility bars - cues to slow down Bosu: Balance, lateral step ups x 10 B, fwd step ups x 10 B SLS Single leg star taps x 10 B Cone taps (3)  x 10 B Rhomberg partial B, then tandem stance B Tandem walking fwd and bwd x 2 laps ea    PATIENT EDUCATION:  Education details: PT eval findings, anticipated POC, and initial HEP  Person educated: Patient Education method: Explanation, Demonstration, and Handouts Education comprehension: verbalized understanding and returned demonstration  HOME EXERCISE PROGRAM: Access Code: [email or web address] URL: [email or web address] Date: 02/24/2023 Prepared by: Raynelle Fanning  Exercises - Standing Romberg to 1/4 Tandem Stance  - 1 x daily - 3-4 x weekly - 1 sets - 10 reps - Standing Single Leg Stance with Unilateral Counter Support  - 2 x daily - 7 x weekly - 1 sets - 10 reps - as long as you can hold - Tandem Stance in Corner  - 1 x daily - 3-4 x weekly - 1 sets - 10 reps - Star Taps  - 1 x daily - 3-4 x weekly - 1 sets - 10 reps  ASSESSMENT:  CLINICAL IMPRESSION: Ms Strayer presents to skilled PT stating that she having some knee pain.  Patient able to progress with balance tasks during visit.  Patient unable to maintain single leg stance for cone touching activity, so altered to patient performing in kick stand position.  Patient requires close SBA/CGA throughout session with cuing for decreased speed and improved control.  Patient requires UE support of barre with activities standing on foam pad.  Patient continues to have decreased balance and continues to require skilled PT to assist with decreasing overall fall risk.  OBJECTIVE IMPAIRMENTS: Abnormal gait, decreased balance, decreased strength, impaired flexibility, and postural dysfunction.   ACTIVITY LIMITATIONS: bending, stairs, transfers, and locomotion level  PARTICIPATION LIMITATIONS: shopping and community activity  PERSONAL FACTORS: Age are also affecting patient's  functional outcome.   REHAB POTENTIAL: Excellent  CLINICAL DECISION MAKING: Stable/uncomplicated  EVALUATION COMPLEXITY: Low   GOALS: Goals reviewed with patient? Yes  SHORT TERM GOALS: Target date: 03/17/2023   Patient will be independent with initial HEP. Baseline:  Goal status: IN PROGRESS  2.   Patient will be educated on strategies to decrease risk of falls.  Baseline:  Goal status: IN PROGRESS   LONG TERM GOALS: Target date: 04/14/2023  Patient will be independent with advanced/ongoing HEP to improve outcomes and carryover.  Baseline:  Goal status: INITIAL  2.   Patient will score 54/56 on Berg Balance test to demonstrate lower risk of falls.   Baseline: 50/56 Goal status: INITIAL  3.  Patient will be able to step up/down curb safely with LRAD for safety with community ambulation.  Baseline:  Goal status: INITIAL   4.  Patient will demonstrate improved functional LE strength as demonstrated by ability to perform floor to stand transfer. Baseline:  Goal status: INITIAL  5.  Patient will  demonstrate improved safety with gait by reporting decreased incidence of catching her feet or tripping by 75% or more. Baseline:  Goal status: INITIAL  6.  Patient to report no falls. Baseline:  Goal status: INITIAL     PLAN:  PT FREQUENCY: 2x/week  PT DURATION: 8 weeks  PLANNED INTERVENTIONS: 97110-Therapeutic exercises, 97530- Therapeutic activity, [id number]- Neuromuscular re-education, 97535- Self Care, [id number]- Manual therapy, [phone]- Gait training, [phone]- Aquatic Therapy, Patient/Family education, Balance training, Stair training, Vestibular training, and Visual/preceptual remediation/compensation  PLAN FOR NEXT SESSION: work on higher level balance and gait activities; narrow BOS, vestibular assessment if indicated.   Reather Laurence, PT, DPT 03/03/23, 12:03 PM  [facility] [address] [place], [place] [address] Phone #  [phone] Fax [phone]

## 2023-03-06 NOTE — Therapy (Signed)
OUTPATIENT PHYSICAL THERAPY LOWER EXTREMITY TREATMENT   Patient Name: Tabitha Baker MRN: [id number] DOB:1950/12/03, 72 y.o., female Today's Date: 03/07/2023  END OF SESSION:  PT End of Session - 03/07/23 0936     Visit Number 4    Date for PT Re-Evaluation 04/14/23    Authorization Type MCR    Progress Note Due on Visit 10    PT Start Time 0935    PT Stop Time 1020    PT Time Calculation (min) 45 min    Activity Tolerance Patient tolerated treatment well    Behavior During Therapy [facility] for tasks assessed/performed               Past Medical History:  Diagnosis Date   Age-related osteoporosis without current pathological fracture 03/18/2021   Allergy    Aortic valve disorder 09/01/2020   B-cell lymphoma (HCC)    Benign neoplasm of nasal cavity, middle ear, 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/21/2022   Her nose appears irritated.   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   Hardening of the aorta (main artery of the heart) (HCC) 09/01/2020   Hearing loss    History of squamous cell carcinoma    Hyperlipidemia    Hypothyroidism    Lymphoma in remission 02/21/2022   pt with hx of non hodgkins lymphoma needs f/u with dr Oda Cogan   Mild aortic regurgitation    Mild tricuspid regurgitation    Mixed dyslipidemia 02/17/2020   Non-neoplastic nevus 02/21/2022   Osteoporosis    Pulmonic valve regurgitation 09/01/2020   Sinusitis 02/21/2022   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    UTI (urinary tract infection)    Past Surgical History:  Procedure Laterality Date   ABDOMINAL HYSTERECTOMY     APPENDECTOMY     EYE SURGERY     HERNIA REPAIR     Patient Active Problem List   Diagnosis Date Noted   Benign neoplasm of nasal cavity, middle ear, 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/21/2022   Depression 02/21/2022   Dry eye syndrome  02/21/2022   Lymphoma in remission 02/21/2022   Non-neoplastic nevus 02/21/2022   Sinusitis 02/21/2022   Age-related osteoporosis without current pathological fracture 03/18/2021   Aortic valve disorder 09/01/2020   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9 (ICD-10-CM) - Unspecified abnormalities of gait and mobility  THERAPY DIAG:  Unsteadiness on feet  Other abnormalities of gait and mobility  Rationale for Evaluation and Treatment: Rehabilitation  ONSET DATE: recent months  SUBJECTIVE:   SUBJECTIVE STATEMENT: I thnk I'm doing a little better. I didn't  get dizzy leaning over the kitty's water dish.  PERTINENT HISTORY:  Osteopenia, lymphoma remission, turner syndrome (poor spatial/hearing issues) PAIN:  Are you having pain? Yes: NPRS scale: 3/10 Pain location: bilateral knees Pain description: aching Aggravating factors: increased movement Relieving factors: rest  PRECAUTIONS: Fall  RED FLAGS: None   WEIGHT BEARING RESTRICTIONS: No  FALLS:  Has patient fallen in last 6 months? Yes. Number of falls 2  LIVING ENVIRONMENT: Lives with: lives with their son Lives in: House/apartment Stairs: Yes: Internal: 16 steps; on right going up and External: 1 steps; 2 inch step Has following equipment at home:  walking  stick  OCCUPATION: retired  PLOF: Independent  PATIENT GOALS: Get my balance better, safe with walking  NEXT MD VISIT: none scheduled  OBJECTIVE:  Note: Objective measures were completed at Evaluation unless otherwise noted.  DIAGNOSTIC FINDINGS: none  PATIENT SURVEYS:  Eval:  ABC scale  1270 / 1600 = 79.4 %  COGNITION: Overall cognitive status: Within functional limits for tasks assessed     SENSATION: WFL   MUSCLE LENGTH: Tight B piriformis  POSTURE: flexed trunk   LOWER EXTREMITY ROM: WNL for task assessed  LOWER EXTREMITY MMT:  MMT Right eval Left eval  Hip flexion 4 4+  Hip extension 5 5  Hip abduction 5 5  Hip adduction    Hip internal rotation    Hip external rotation    Knee flexion 4- 5  Knee extension 5 5  Ankle dorsiflexion 5 5  Ankle plantarflexion    Ankle inversion    Ankle eversion     (Blank rows = not tested)  FUNCTIONAL TESTS:  Berg Balance Scale: 50/56 Dynamic Gait Index: 20/24 10/25: SLS R 3 sec, L 7 sec  GAIT: Distance walked: 40 Assistive device utilized: None Level of assistance: Complete Independence Comments: deviates from straight path, quick cadence, wide BOS   TODAY'S TREATMENT:                                                                                                                              DATE:   03/07/2023 Nustep level 5 x5 min with PT present to discuss status Standing alt toe tap to 3 cones 2x10 with SBA Seated hamstring stretch x30 sec bilat Seated piriformis stretch x30 sec bilat (more difficulty with right leg) SLS performing touching to cone on PT mat in front of patient 2 x10 bilat CGA 4 square steps to colored dots on floor:  x5 each direction Standing on foam pad at barre one UE assist:  hip abduction, hip extension, marching.  2x10 each bilat (with cuing for technique, body mechanics/posture)  03/03/2023 Nustep level 5 x5 min with PT present to discuss status Standing alt toe tap to 2 cones  2x10 with CGA from PT Seated hamstring stretch x20 sec bilat Seated piriformis stretch x20 sec bilat (more difficulty with right leg) Standing in kickstand position performing touching to cone on PT mat  in front of patient x10 bilat 4 square steps to colored dots on floor:  x5 each direction Standing on foam pad at barre:  hip abduction, hip extension, marching.  2x10 each bilat (with cuing for technique, body mechanics/posture)   02/24/23 All activities done in Parallel Bars with SBA to CGA:  Fwd and lateral walking over agility bars - cues to slow down Bosu: Balance, lateral step ups x 10 B, fwd step ups x 10 B SLS Single leg star taps x 10 B Cone taps (3) x 10 B Rhomberg partial B, then tandem stance B Tandem walking fwd and bwd x 2 laps ea    PATIENT EDUCATION:  Education details: PT eval findings, anticipated POC, and initial HEP  Person educated: Patient Education method: Explanation, Demonstration, and Handouts Education comprehension: verbalized understanding and returned demonstration  HOME EXERCISE PROGRAM: Access Code: [email or web address] URL: [email or web address] Date: 03/07/2023 Prepared by: Raynelle Fanning  Exercises - Standing Romberg to 1/4 Tandem Stance  - 1 x daily - 3-4 x weekly - 1 sets - 10 reps - Standing Single Leg Stance with Unilateral Counter Support  - 2 x daily - 7 x weekly - 1 sets - 10 reps - as long as you can hold - Tandem Stance in Corner  - 1 x daily - 3-4 x weekly - 1 sets - 10 reps - Star Taps  - 1 x daily - 3-4 x weekly - 1 sets - 10 reps - Standing Hip Abduction with Counter Support  - 1 x daily - 3-4 x weekly - 1 sets - 10 reps - Standing Hip Extension with Counter Support  - 1 x daily - 3-4 x weekly - 1 sets - 10 reps - Standing March with Counter Support  - 1 x daily - 3-4 x weekly - 1 sets - 10 reps  ASSESSMENT:  CLINICAL IMPRESSION: Sherryn was able to progress with balance activities today requiring less supervision with her exercises.  All SLS exercises remain challenging including hip strengthening when standing on foam. She reports small improvements with balance with ADLS. She continues to demonstrate potential for improvement with strength and balance and would benefit from continued skilled therapy to address impairments.    OBJECTIVE IMPAIRMENTS: Abnormal gait, decreased balance, decreased strength, impaired flexibility, and postural dysfunction.   ACTIVITY LIMITATIONS: bending, stairs, transfers, and locomotion level  PARTICIPATION LIMITATIONS: shopping and community activity  PERSONAL FACTORS: Age are also affecting patient's functional outcome.   REHAB POTENTIAL: Excellent  CLINICAL DECISION MAKING: Stable/uncomplicated  EVALUATION COMPLEXITY: Low   GOALS: Goals reviewed with patient? Yes  SHORT TERM GOALS: Target date: 03/17/2023   Patient will be independent with initial HEP. Baseline:  Goal status: IN PROGRESS  2.   Patient will be educated on strategies to decrease risk of falls.  Baseline:  Goal status: IN PROGRESS   LONG TERM GOALS: Target date: 04/14/2023  Patient will be independent with advanced/ongoing HEP to improve outcomes and carryover.  Baseline:  Goal status: INITIAL  2.   Patient will score 54/56 on Berg Balance test to demonstrate lower risk of falls.   Baseline: 50/56 Goal status: INITIAL  3.  Patient will be able to step up/down curb safely with LRAD for safety with community ambulation.  Baseline:  Goal status: INITIAL   4.  Patient will demonstrate improved functional LE strength as demonstrated by ability to perform floor to stand transfer. Baseline:  Goal status: INITIAL  5.  Patient will  demonstrate improved safety with gait  by reporting decreased incidence of catching her feet or tripping by 75% or more. Baseline:  Goal status: INITIAL  6.  Patient to report no falls. Baseline:  Goal status: INITIAL     PLAN:  PT FREQUENCY: 2x/week  PT DURATION: 8  weeks  PLANNED INTERVENTIONS: 97110-Therapeutic exercises, 97530- Therapeutic activity, [id number]- Neuromuscular re-education, 97535- Self Care, [id number]- Manual therapy, [phone]- Gait training, [phone]- Aquatic Therapy, Patient/Family education, Balance training, Stair training, Vestibular training, and Visual/preceptual remediation/compensation  PLAN FOR NEXT SESSION: work on higher level balance and gait activities; narrow BOS, vestibular assessment if indicated.   Solon Palm, PT  03/07/23, 1:31 PM  [facility] [address], [address] [place], [place] [address] Phone # [phone] Fax [phone]

## 2023-03-07 ENCOUNTER — Ambulatory Visit: Payer: Medicare Other | Admitting: Physical Therapy

## 2023-03-07 ENCOUNTER — Encounter: Payer: Self-pay | Admitting: Physical Therapy

## 2023-03-07 DIAGNOSIS — R2681 Unsteadiness on feet: Secondary | ICD-10-CM | POA: Diagnosis not present

## 2023-03-07 DIAGNOSIS — R2689 Other abnormalities of gait and mobility: Secondary | ICD-10-CM

## 2023-03-08 NOTE — Therapy (Signed)
OUTPATIENT PHYSICAL THERAPY LOWER EXTREMITY TREATMENT   Patient Name: Tabitha Baker MRN: [id number] DOB:[birth year], 72 y.o., female Today's Date: 03/09/2023  END OF SESSION:  PT End of Session - 03/09/23 1147     Visit Number 5    Date for PT Re-Evaluation 04/14/23    Authorization Type MCR    Progress Note Due on Visit 10    PT Start Time 1145    PT Stop Time 1228    PT Time Calculation (min) 43 min    Activity Tolerance Patient tolerated treatment well    Behavior During Therapy [facility] for tasks assessed/performed                Past Medical History:  Diagnosis Date   Age-related osteoporosis without current pathological fracture 03/18/2021   Allergy    Aortic valve disorder 09/01/2020   B-cell lymphoma (HCC)    Benign neoplasm of nasal cavity, middle ear, 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/21/2022   Her nose appears irritated.   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   Hardening of the aorta (main artery of the heart) (HCC) 09/01/2020   Hearing loss    History of squamous cell carcinoma    Hyperlipidemia    Hypothyroidism    Lymphoma in remission 02/21/2022   pt with hx of non hodgkins lymphoma needs f/u with dr Oda Cogan   Mild aortic regurgitation    Mild tricuspid regurgitation    Mixed dyslipidemia 02/17/2020   Non-neoplastic nevus 02/21/2022   Osteoporosis    Pulmonic valve regurgitation 09/01/2020   Sinusitis 02/21/2022   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    UTI (urinary tract infection)    Past Surgical History:  Procedure Laterality Date   ABDOMINAL HYSTERECTOMY     APPENDECTOMY     EYE SURGERY     HERNIA REPAIR     Patient Active Problem List   Diagnosis Date Noted   Benign neoplasm of nasal cavity, middle ear, 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/21/2022   Depression 02/21/2022   Dry eye  syndrome 02/21/2022   Lymphoma in remission 02/21/2022   Non-neoplastic nevus 02/21/2022   Sinusitis 02/21/2022   Age-related osteoporosis without current pathological fracture 03/18/2021   Aortic valve disorder 09/01/2020   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9 (ICD-10-CM) - Unspecified abnormalities of gait and mobility  THERAPY DIAG:  Unsteadiness on feet  Other abnormalities of gait and mobility  Rationale for Evaluation and Treatment: Rehabilitation  ONSET DATE: recent months  SUBJECTIVE:   SUBJECTIVE STATEMENT: My balance is getting better, but I still  get a little dizzy if I turn my head too fast.  PERTINENT HISTORY:  Osteopenia, lymphoma remission, turner syndrome (poor spatial/hearing issues) PAIN:  Are you having pain? Yes: NPRS scale: 3/10 Pain location: bilateral hips Pain description: aching Aggravating factors: increased movement Relieving factors: rest  PRECAUTIONS: Fall  RED FLAGS: None   WEIGHT BEARING RESTRICTIONS: No  FALLS:  Has patient fallen in last 6 months? Yes. Number of falls 2  LIVING ENVIRONMENT: Lives with: lives with their son Lives in: House/apartment Stairs: Yes: Internal: 16 steps; on right going up and External: 1 steps; 2 inch step Has following equipment at home:  walking  stick  OCCUPATION: retired  PLOF: Independent  PATIENT GOALS: Get my balance better, safe with walking  NEXT MD VISIT: none scheduled  OBJECTIVE:  Note: Objective measures were completed at Evaluation unless otherwise noted.  DIAGNOSTIC FINDINGS: none  PATIENT SURVEYS:  Eval:  ABC scale  1270 / 1600 = 79.4 %  COGNITION: Overall cognitive status: Within functional limits for tasks assessed     SENSATION: WFL   MUSCLE LENGTH: Tight B piriformis  POSTURE: flexed trunk   LOWER EXTREMITY ROM: WNL for task assessed  LOWER EXTREMITY MMT:  MMT Right eval Left eval  Hip flexion 4 4+  Hip extension 5 5  Hip abduction 5 5  Hip adduction    Hip internal rotation    Hip external rotation    Knee flexion 4- 5  Knee extension 5 5  Ankle dorsiflexion 5 5  Ankle plantarflexion    Ankle inversion    Ankle eversion     (Blank rows = not tested)  FUNCTIONAL TESTS:  Berg Balance Scale: 50/56 Dynamic Gait Index: 20/24 10/25: SLS R 3 sec, L 7 sec  GAIT: Distance walked: 40 Assistive device utilized: None Level of assistance: Complete Independence Comments: deviates from straight path, quick cadence, wide BOS   TODAY'S TREATMENT:                                                                                                                              DATE:   03/09/2023 Nustep level 5 x5 min with PT present to discuss status Standing alt toe tap to 3 cones 2x10 with SBA Seated piriformis stretch 2x30 sec bilat (more difficulty with right leg) SLS performing touching to cone on PT mat in front of patient 1x5 B, then 1x5 in kickstand position - difficult today so moved to II bars 1 x10 bilat reaching for cone on stool; also did prolonged T position  holds fwith one hand touching cone  4 square steps to colored dots on floor:  x5 each direction Start taps x 5 B - cross body fwd and bwd most difficult Standing on foam pad at barre intermittent UE assist:  hip abduction  2x10, marching no UE assist  2x10 B with cues to slow down with marching. Second set of marching holding 3# wts; standing with  horizontal head turns x 5 B  03/07/2023 Nustep level 5 x5 min with PT present to discuss status Standing alt toe tap to 3 cones 2x10 with SBA Seated hamstring stretch x30 sec bilat Seated piriformis stretch x30 sec bilat (more difficulty with right leg) SLS performing touching to cone on PT mat in front of patient 2 x10 bilat CGA 4 square steps to colored dots on floor:  x5 each direction Standing on foam pad at barre one UE assist:  hip abduction, hip extension, marching.  2x10 each bilat (with cuing for technique, body mechanics/posture)  03/03/2023 Nustep level 5 x5 min with PT present to discuss status Standing alt toe tap to 2 cones 2x10 with CGA from PT Seated hamstring stretch x20 sec bilat Seated piriformis stretch x20 sec bilat (more difficulty with right leg) Standing in kickstand position performing touching to cone on PT mat in front of patient x10 bilat 4 square steps to colored dots on floor:  x5 each direction Standing on foam pad at barre:  hip abduction, hip extension, marching.  2x10 each bilat (with cuing for technique, body mechanics/posture)   02/24/23 All activities done in Parallel Bars with SBA to CGA:  Fwd and lateral walking over agility bars - cues to slow down Bosu: Balance, lateral step ups x 10 B, fwd step ups x 10 B SLS Single leg star taps x 10 B Cone taps (3) x 10 B Rhomberg partial B, then tandem stance B Tandem walking fwd and bwd x 2 laps ea    PATIENT EDUCATION:  Education details: PT eval findings, anticipated POC, and initial HEP  Person educated: Patient Education method: Explanation, Demonstration, and Handouts Education comprehension: verbalized understanding and returned demonstration  HOME EXERCISE PROGRAM: Access Code: [email or web address] URL: [email or web address] Date: 03/09/2023 Prepared by:  Raynelle Fanning  Exercises - Standing Romberg to 1/4 Tandem Stance  - 1 x daily - 3-4 x weekly - 1 sets - 10 reps - Standing Single Leg Stance with Unilateral Counter Support  - 2 x daily - 7 x weekly - 1 sets - 10 reps - as long as you can hold - Tandem Stance in Corner  - 1 x daily - 3-4 x weekly - 1 sets - 10 reps - Star Taps  - 1 x daily - 3-4 x weekly - 1 sets - 10 reps - Standing Hip Abduction with Counter Support  - 1 x daily - 3-4 x weekly - 1 sets - 10 reps - Standing Hip Extension with Counter Support  - 1 x daily - 3-4 x weekly - 1 sets - 10 reps - Standing March with Counter Support  - 1 x daily - 3-4 x weekly - 1 sets - 10 reps - Seated Piriformis Stretch with Trunk Bend  - 2 x daily - 7 x weekly - 1 sets - 3 reps - 30-60 sec  hold  ASSESSMENT:  CLINICAL IMPRESSION: Tabitha Baker reports increased soreness in hips since last visit. She had relief after seated figure 4 stretch, so this was issued to HEP. She had a little more difficulty with SL stance reaches today, but overall she continues to improve with balance. She reports her balance is improving, but she still experiences dizziness with quick head turns.  OBJECTIVE IMPAIRMENTS: Abnormal gait, decreased balance, decreased strength, impaired flexibility, and postural dysfunction.   ACTIVITY LIMITATIONS: bending, stairs, transfers, and locomotion level  PARTICIPATION LIMITATIONS: shopping and community activity  PERSONAL FACTORS: Age are also affecting patient's functional outcome.  REHAB POTENTIAL: Excellent  CLINICAL DECISION MAKING: Stable/uncomplicated  EVALUATION COMPLEXITY: Low   GOALS: Goals reviewed with patient? Yes  SHORT TERM GOALS: Target date: 03/17/2023   Patient will be independent with initial HEP. Baseline:  Goal status: IN PROGRESS  2.   Patient will be educated on strategies to decrease risk of falls.  Baseline:  Goal status: IN PROGRESS   LONG TERM GOALS: Target date: 04/14/2023  Patient will be  independent with advanced/ongoing HEP to improve outcomes and carryover.  Baseline:  Goal status: INITIAL  2.   Patient will score 54/56 on Berg Balance test to demonstrate lower risk of falls.   Baseline: 50/56 Goal status: INITIAL  3.  Patient will be able to step up/down curb safely with LRAD for safety with community ambulation.  Baseline:  Goal status: INITIAL   4.  Patient will demonstrate improved functional LE strength as demonstrated by ability to perform floor to stand transfer. Baseline:  Goal status: INITIAL  5.  Patient will  demonstrate improved safety with gait by reporting decreased incidence of catching her feet or tripping by 75% or more. Baseline:  Goal status: INITIAL  6.  Patient to report no falls. Baseline:  Goal status: INITIAL     PLAN:  PT FREQUENCY: 2x/week  PT DURATION: 8 weeks  PLANNED INTERVENTIONS: 97110-Therapeutic exercises, 97530- Therapeutic activity, [id number]- Neuromuscular re-education, 97535- Self Care, 22025- Manual therapy, [phone]- Gait training, [phone]- Aquatic Therapy, Patient/Family education, Balance training, Stair training, Vestibular training, and Visual/preceptual remediation/compensation  PLAN FOR NEXT SESSION: work on higher level balance and gait activities; narrow BOS, vestibular assessment if indicated.   Solon Palm, PT  03/09/23, 1:35 PM  [facility] [address], [address] [place], [place] [address] Phone # [phone] Fax [phone]

## 2023-03-09 ENCOUNTER — Encounter: Payer: Self-pay | Admitting: Physical Therapy

## 2023-03-09 ENCOUNTER — Ambulatory Visit: Payer: Medicare Other | Admitting: Physical Therapy

## 2023-03-09 DIAGNOSIS — R2681 Unsteadiness on feet: Secondary | ICD-10-CM

## 2023-03-09 DIAGNOSIS — R2689 Other abnormalities of gait and mobility: Secondary | ICD-10-CM

## 2023-03-12 NOTE — Therapy (Signed)
OUTPATIENT PHYSICAL THERAPY LOWER EXTREMITY TREATMENT   Patient Name: Tabitha Baker MRN: [id number] DOB:1951-02-21, 72 y.o., female Today's Date: 03/13/2023  END OF SESSION:  PT End of Session - 03/13/23 0937     Visit Number 6    Date for PT Re-Evaluation 04/14/23    Authorization Type MCR KX after 15    PT Start Time 0935    PT Stop Time 1014    PT Time Calculation (min) 39 min    Activity Tolerance Patient tolerated treatment well    Behavior During Therapy [facility] for tasks assessed/performed                 Past Medical History:  Diagnosis Date   Age-related osteoporosis without current pathological fracture 03/18/2021   Allergy    Aortic valve disorder 09/01/2020   B-cell lymphoma (HCC)    Benign neoplasm of nasal cavity, middle ear, 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/21/2022   Her nose appears irritated.   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   Hardening of the aorta (main artery of the heart) (HCC) 09/01/2020   Hearing loss    History of squamous cell carcinoma    Hyperlipidemia    Hypothyroidism    Lymphoma in remission 02/21/2022   pt with hx of non hodgkins lymphoma needs f/u with dr Oda Cogan   Mild aortic regurgitation    Mild tricuspid regurgitation    Mixed dyslipidemia 02/17/2020   Non-neoplastic nevus 02/21/2022   Osteoporosis    Pulmonic valve regurgitation 09/01/2020   Sinusitis 02/21/2022   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    UTI (urinary tract infection)    Past Surgical History:  Procedure Laterality Date   ABDOMINAL HYSTERECTOMY     APPENDECTOMY     EYE SURGERY     HERNIA REPAIR     Patient Active Problem List   Diagnosis Date Noted   Benign neoplasm of nasal cavity, middle ear, 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/21/2022   Depression 02/21/2022   Dry eye syndrome 02/21/2022    Lymphoma in remission 02/21/2022   Non-neoplastic nevus 02/21/2022   Sinusitis 02/21/2022   Age-related osteoporosis without current pathological fracture 03/18/2021   Aortic valve disorder 09/01/2020   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9 (ICD-10-CM) - Unspecified abnormalities of gait and mobility  THERAPY DIAG:  Unsteadiness on feet  Other abnormalities of gait and mobility  Rationale for Evaluation and Treatment: Rehabilitation  ONSET DATE: recent months  SUBJECTIVE:   SUBJECTIVE STATEMENT: Still get dizzy turning around too fast. Going down stairs is better.  PERTINENT HISTORY:  Osteopenia, lymphoma remission, turner syndrome (poor spatial/hearing issues) PAIN:  Are you having pain? Yes: NPRS scale: 3/10 Pain location: bilateral hips Pain description: aching Aggravating factors: increased movement Relieving factors: rest  PRECAUTIONS: Fall  RED FLAGS: None   WEIGHT BEARING RESTRICTIONS: No  FALLS:  Has patient fallen in last 6 months? Yes. Number of falls 2  LIVING ENVIRONMENT: Lives with: lives with their son Lives in: House/apartment Stairs: Yes: Internal: 16 steps; on right going up and External: 1 steps; 2 inch step Has following equipment at home:  walking stick  OCCUPATION: retired  PLOF:  Independent  PATIENT GOALS: Get my balance better, safe with walking  NEXT MD VISIT: none scheduled  OBJECTIVE:  Note: Objective measures were completed at Evaluation unless otherwise noted.  DIAGNOSTIC FINDINGS: none  PATIENT SURVEYS:  Eval:  ABC scale  1270 / 1600 = 79.4 %  COGNITION: Overall cognitive status: Within functional limits for tasks assessed     SENSATION: WFL   MUSCLE LENGTH: Tight B piriformis  POSTURE: flexed trunk   LOWER EXTREMITY ROM: WNL for task assessed  LOWER EXTREMITY MMT:  MMT Right eval Left eval  Hip flexion 4 4+  Hip extension 5 5  Hip abduction 5 5  Hip adduction    Hip internal rotation    Hip external rotation    Knee flexion 4- 5  Knee extension 5 5  Ankle dorsiflexion 5 5  Ankle plantarflexion    Ankle inversion    Ankle eversion     (Blank rows = not tested)  FUNCTIONAL TESTS:  Berg Balance Scale: 50/56 Dynamic Gait Index: 20/24 10/25: SLS R 3 sec, L 7 sec  GAIT: Distance walked: 40 Assistive device utilized: None Level of assistance: Complete Independence Comments: deviates from straight path, quick cadence, wide BOS   TODAY'S TREATMENT:                                                                                                                              DATE:   03/13/2023 Nustep level 5 x5 min with PT present to discuss status   fall prevention techniques reviewed with patient Floor to stand transfers on mat - practiced B with and without support of mat table 1/2 kneel chops with weighted blue ball on mat  x 10 B Standing split stance chops with weighted blue ball on mat x 10 B Standing alt toe tap to 3 cones 2x10 with SBA Seated hamstring stretch x30 sec bilat Seated piriformis stretch x30 sec bilat (more difficulty with right leg) SLS performing touching to cone on PT mat in front of patient 2 x10 bilat CGA   03/09/2023 Nustep level 5 x5 min with PT present to discuss status Standing alt toe tap to  3 cones 2x10 with SBA Seated piriformis stretch 2x30 sec bilat (more difficulty with right leg) SLS performing touching to cone on PT mat in front of patient 1x5 B,  then 1x5 in kickstand position - difficult today so moved to II bars 1 x10 bilat reaching for cone on stool; also did prolonged T position  holds fwith one hand touching cone  4 square steps to colored dots on floor:  x5 each direction Start taps x 5 B - cross body fwd and bwd most difficult Standing on foam pad at barre intermittent UE assist:  hip abduction 2x10, marching no UE assist  2x10 B with cues to slow down with marching. Second set of marching holding 3# wts; standing with horizontal head turns x 5 B  03/07/2023 Nustep level 5 x5 min with PT present to discuss status Standing alt toe tap to 3 cones 2x10 with SBA Seated hamstring stretch x30 sec bilat Seated piriformis stretch x30 sec bilat (more difficulty with right leg) SLS performing touching to cone on PT mat in front of patient 2 x10 bilat CGA 4 square steps to colored dots on floor:  x5 each direction Standing on foam pad at barre one UE assist:  hip abduction, hip extension, marching.  2x10 each bilat (with cuing for technique, body mechanics/posture)  03/03/2023 Nustep level 5 x5 min with PT present to discuss status Standing alt toe tap to 2 cones 2x10 with CGA from PT Seated hamstring stretch x20 sec bilat Seated piriformis stretch x20 sec bilat (more difficulty with right leg) Standing in kickstand position performing touching to cone on PT mat in front of patient x10 bilat 4 square steps to colored dots on floor:  x5 each direction Standing on foam pad at barre:  hip abduction, hip extension, marching.  2x10 each bilat (with cuing for technique, body mechanics/posture)   02/24/23 All activities done in Parallel Bars with SBA to CGA:  Fwd and lateral walking over agility bars - cues to slow down Bosu: Balance, lateral step ups x 10 B, fwd step ups x 10  B SLS Single leg star taps x 10 B Cone taps (3) x 10 B Rhomberg partial B, then tandem stance B Tandem walking fwd and bwd x 2 laps ea    PATIENT EDUCATION:  Education details: HEP update  Person educated: Patient Education method: Explanation, Demonstration, and Handouts Education comprehension: verbalized understanding and returned demonstration  HOME EXERCISE PROGRAM: Access Code: [email or web address] URL: [email or web address] Date: 03/09/2023 Prepared by: Raynelle Fanning  Exercises - Standing Romberg to 1/4 Tandem Stance  - 1 x daily - 3-4 x weekly - 1 sets - 10 reps - Standing Single Leg Stance with Unilateral Counter Support  - 2 x daily - 7 x weekly - 1 sets - 10 reps - as long as you can hold - Tandem Stance in Corner  - 1 x daily - 3-4 x weekly - 1 sets - 10 reps - Star Taps  - 1 x daily - 3-4 x weekly - 1 sets - 10 reps - Standing Hip Abduction with Counter Support  - 1 x daily - 3-4 x weekly - 1 sets - 10 reps - Standing Hip Extension with Counter Support  - 1 x daily - 3-4 x weekly - 1 sets - 10 reps - Standing March with Counter Support  - 1 x daily - 3-4 x weekly - 1 sets - 10 reps - Seated Piriformis Stretch with Trunk Bend  - 2 x daily - 7 x weekly - 1 sets - 3 reps - 30-60 sec  hold    ASSESSMENT:  CLINICAL IMPRESSION: Luwam continues to report improvements. She says going down  stairs is easier. She is still having dizziness is she turns to quickly and she experienced an incidence of catching her toe waking in the clinic today. She demonstrates good heel strike in general. Floor to stand transfers were worked on today. She demonstrates core instability with 1/2 kneel positioning and in split stance. She is progressing with SLS reaches  and toe taps to cones, but they are both challenging. Titana continues to demonstrate potential for improvement and would benefit from continued skilled therapy to address impairments.    OBJECTIVE IMPAIRMENTS: Abnormal gait, decreased  balance, decreased strength, impaired flexibility, and postural dysfunction.   ACTIVITY LIMITATIONS: bending, stairs, transfers, and locomotion level  PARTICIPATION LIMITATIONS: shopping and community activity  PERSONAL FACTORS: Age are also affecting patient's functional outcome.   REHAB POTENTIAL: Excellent  CLINICAL DECISION MAKING: Stable/uncomplicated  EVALUATION COMPLEXITY: Low   GOALS: Goals reviewed with patient? Yes  SHORT TERM GOALS: Target date: 03/17/2023   Patient will be independent with initial HEP. Baseline:  Goal status: IN PROGRESS  2.   Patient will be educated on strategies to decrease risk of falls.  Baseline:  Goal status: MET   LONG TERM GOALS: Target date: 04/14/2023  Patient will be independent with advanced/ongoing HEP to improve outcomes and carryover.  Baseline:  Goal status: INITIAL  2.   Patient will score 54/56 on Berg Balance test to demonstrate lower risk of falls.   Baseline: 50/56 Goal status: INITIAL  3.  Patient will be able to step up/down curb safely with LRAD for safety with community ambulation.  Baseline:  Goal status: INITIAL   4.  Patient will demonstrate improved functional LE strength as demonstrated by ability to perform floor to stand transfer. Baseline:  Goal status: IN PROGRESS   5.  Patient will  demonstrate improved safety with gait by reporting decreased incidence of catching her feet or tripping by 75% or more. Baseline:  Goal status: INITIAL  6.  Patient to report no falls. Baseline:  Goal status: INITIAL     PLAN:  PT FREQUENCY: 2x/week  PT DURATION: 8 weeks  PLANNED INTERVENTIONS: 97110-Therapeutic exercises, 97530- Therapeutic activity, 97112- Neuromuscular re-education, 97535- Self Care, [id number]- Manual therapy, [phone]- Gait training, [phone]- Aquatic Therapy, Patient/Family education, Balance training, Stair training, Vestibular training, and Visual/preceptual remediation/compensation  PLAN FOR  NEXT SESSION: work on higher level balance and gait activities; 1/2 kneel and standing core; turning activities, narrow BOS, vestibular assessment if indicated.   Solon Palm, PT  03/13/23, 10:26 AM  [facility] [address], [address] [place], [place] [address] Phone # [phone] Fax [phone]

## 2023-03-13 ENCOUNTER — Encounter: Payer: Self-pay | Admitting: Physical Therapy

## 2023-03-13 ENCOUNTER — Ambulatory Visit: Payer: Medicare Other | Admitting: Physical Therapy

## 2023-03-13 DIAGNOSIS — R2681 Unsteadiness on feet: Secondary | ICD-10-CM

## 2023-03-13 DIAGNOSIS — R2689 Other abnormalities of gait and mobility: Secondary | ICD-10-CM

## 2023-03-13 NOTE — Patient Instructions (Signed)

## 2023-03-16 ENCOUNTER — Ambulatory Visit: Payer: Medicare Other | Admitting: Physical Therapy

## 2023-03-16 ENCOUNTER — Encounter: Payer: Self-pay | Admitting: Physical Therapy

## 2023-03-16 DIAGNOSIS — R2689 Other abnormalities of gait and mobility: Secondary | ICD-10-CM

## 2023-03-16 DIAGNOSIS — R2681 Unsteadiness on feet: Secondary | ICD-10-CM | POA: Diagnosis not present

## 2023-03-16 NOTE — Therapy (Signed)
OUTPATIENT PHYSICAL THERAPY LOWER EXTREMITY TREATMENT   Patient Name: Tabitha Baker MRN: [id number] DOB:11-27-1950, 72 y.o., female Today's Date: 03/16/2023  END OF SESSION:  PT End of Session - 03/16/23 1151     Visit Number 7    Date for PT Re-Evaluation 04/14/23    Authorization Type MCR KX after 15    Progress Note Due on Visit 10    PT Start Time 1150    PT Stop Time 1230    PT Time Calculation (min) 40 min    Activity Tolerance Patient tolerated treatment well    Behavior During Therapy [facility] for tasks assessed/performed                 Past Medical History:  Diagnosis Date   Age-related osteoporosis without current pathological fracture 03/18/2021   Allergy    Aortic valve disorder 09/01/2020   B-cell lymphoma (HCC)    Benign neoplasm of nasal cavity, middle ear, 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/21/2022   Her nose appears irritated.   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   Hardening of the aorta (main artery of the heart) (HCC) 09/01/2020   Hearing loss    History of squamous cell carcinoma    Hyperlipidemia    Hypothyroidism    Lymphoma in remission 02/21/2022   pt with hx of non hodgkins lymphoma needs f/u with dr Oda Cogan   Mild aortic regurgitation    Mild tricuspid regurgitation    Mixed dyslipidemia 02/17/2020   Non-neoplastic nevus 02/21/2022   Osteoporosis    Pulmonic valve regurgitation 09/01/2020   Sinusitis 02/21/2022   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    UTI (urinary tract infection)    Past Surgical History:  Procedure Laterality Date   ABDOMINAL HYSTERECTOMY     APPENDECTOMY     EYE SURGERY     HERNIA REPAIR     Patient Active Problem List   Diagnosis Date Noted   Benign neoplasm of nasal cavity, middle ear, 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/21/2022   Depression 02/21/2022    Dry eye syndrome 02/21/2022   Lymphoma in remission 02/21/2022   Non-neoplastic nevus 02/21/2022   Sinusitis 02/21/2022   Age-related osteoporosis without current pathological fracture 03/18/2021   Aortic valve disorder 09/01/2020   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9 (ICD-10-CM) - Unspecified abnormalities of gait and mobility  THERAPY DIAG:  Unsteadiness on feet  Other abnormalities of gait and mobility  Rationale for Evaluation and Treatment: Rehabilitation  ONSET DATE: recent months  SUBJECTIVE:   SUBJECTIVE STATEMENT: Still get dizzy turning  around too fast. Going down stairs is better.   PERTINENT HISTORY:  Osteopenia, lymphoma remission, turner syndrome (poor spatial/hearing issues) PAIN:  Are you having pain? Yes: NPRS scale: 3/10 Pain location: bilateral hips Pain description: aching Aggravating factors: increased movement Relieving factors: rest  PRECAUTIONS: Fall  RED FLAGS: None   WEIGHT BEARING RESTRICTIONS: No  FALLS:  Has patient fallen in last 6 months? Yes. Number of falls 2  LIVING ENVIRONMENT: Lives with: lives with their son Lives in: House/apartment Stairs: Yes: Internal: 16 steps; on right going up and External: 1 steps; 2 inch step Has following equipment at home:  walking  stick  OCCUPATION: retired  PLOF: Independent  PATIENT GOALS: Get my balance better, safe with walking  NEXT MD VISIT: none scheduled  OBJECTIVE:  Note: Objective measures were completed at Evaluation unless otherwise noted.  DIAGNOSTIC FINDINGS: none  PATIENT SURVEYS:  Eval:  ABC scale  1270 / 1600 = 79.4 %  COGNITION: Overall cognitive status: Within functional limits for tasks assessed     SENSATION: WFL   MUSCLE LENGTH: Tight B piriformis  POSTURE: flexed trunk   LOWER EXTREMITY ROM: WNL for task assessed  LOWER EXTREMITY MMT:  MMT Right eval Left eval  Hip flexion 4 4+  Hip extension 5 5  Hip abduction 5 5  Hip adduction    Hip internal rotation    Hip external rotation    Knee flexion 4- 5  Knee extension 5 5  Ankle dorsiflexion 5 5  Ankle plantarflexion    Ankle inversion    Ankle eversion     (Blank rows = not tested)  FUNCTIONAL TESTS:  Berg Balance Scale: 50/56 Dynamic Gait Index: 20/24 10/25: SLS R 3 sec, L 7 sec  GAIT: Distance walked: 40 Assistive device utilized: None Level of assistance: Complete Independence Comments: deviates from straight path, quick cadence, wide BOS   TODAY'S TREATMENT:                                                                                                                              DATE:   03/16/23 Bike L3 x5 min with PT present to discuss status   1/2 kneel chops with 3# on mat  x 10 B on foam pad 1/2 kneel balance with normal  hip width alignment of feet - challenging; then added head turns x 7  Standing split stance for balance - challenging after 1/2 kneel  Seated hamstring stretch 2x30 sec bilat foot on step Seated piriformis stretch x30 sec bilat (more difficulty with right leg) Standing quad stretch with foot on chair x 30 sec B SLS with kickstand performing touching to cone on PT mat in front of patient 1 x10 bilat CGA  03/13/2023 Nustep level 5 x5 min with PT present to discuss status    fall prevention techniques reviewed with patient Floor to stand transfers on mat - practiced B with and without support of mat table  1/2 kneel chops with weighted blue ball on mat  x 10 B Standing split stance chops with weighted blue ball on mat x 10 B Standing alt toe tap to 3 cones 2x10 with SBA Seated hamstring stretch x30 sec bilat Seated piriformis stretch x30 sec bilat (more difficulty with right leg) SLS performing touching to cone on PT mat in front of patient 2 x10 bilat CGA   03/09/2023 Nustep level 5 x5 min with PT present to discuss status Standing alt toe tap to 3 cones 2x10 with SBA Seated piriformis stretch 2x30 sec bilat (more difficulty with right leg) SLS performing touching to cone on PT mat in front of patient 1x5 B, then 1x5 in kickstand position - difficult today so moved to II bars 1 x10 bilat reaching for cone on stool; also did prolonged T position  holds fwith one hand touching cone  4 square steps to colored dots on floor:  x5 each direction Start taps x 5 B - cross body fwd and bwd most difficult Standing on foam pad at barre intermittent UE assist:  hip abduction 2x10, marching no UE assist  2x10 B with cues to slow down with marching. Second set of marching holding 3# wts; standing with horizontal head turns x 5 B  03/07/2023 Nustep level 5 x5 min with PT present to discuss status Standing alt toe tap to 3 cones 2x10 with SBA Seated hamstring stretch x30 sec bilat Seated piriformis stretch x30 sec bilat (more difficulty with right leg) SLS performing touching to cone on PT mat in front of patient 2 x10 bilat CGA 4 square steps to colored dots on floor:  x5 each direction Standing on foam pad at barre one UE assist:  hip abduction, hip extension, marching.  2x10 each bilat (with cuing for technique, body mechanics/posture)  03/03/2023 Nustep level 5 x5 min with PT present to discuss status Standing alt toe tap to 2 cones 2x10 with CGA from PT Seated  hamstring stretch x20 sec bilat Seated piriformis stretch x20 sec bilat (more difficulty with right leg) Standing in kickstand position performing touching to cone on PT mat in front of patient x10 bilat 4 square steps to colored dots on floor:  x5 each direction Standing on foam pad at barre:  hip abduction, hip extension, marching.  2x10 each bilat (with cuing for technique, body mechanics/posture)    PATIENT EDUCATION:  Education details: HEP update  Person educated: Patient Education method: Explanation, Demonstration, and Handouts Education comprehension: verbalized understanding and returned demonstration  HOME EXERCISE PROGRAM: Access Code: [email or web address] URL: [email or web address] Date: 03/09/2023 Prepared by: Raynelle Fanning  Exercises - Standing Romberg to 1/4 Tandem Stance  - 1 x daily - 3-4 x weekly - 1 sets - 10 reps - Standing Single Leg Stance with Unilateral Counter Support  - 2 x daily - 7 x weekly - 1 sets - 10 reps - as long as you can hold - Tandem Stance in Corner  - 1 x daily - 3-4 x weekly - 1 sets - 10 reps - Star Taps  - 1 x daily - 3-4 x weekly - 1 sets - 10 reps - Standing Hip Abduction with Counter Support  - 1 x daily - 3-4 x weekly - 1 sets - 10 reps - Standing Hip Extension with Counter Support  - 1 x daily - 3-4 x weekly - 1 sets - 10 reps - Standing March with Counter Support  - 1 x daily - 3-4 x weekly -  1 sets - 10 reps - Seated Piriformis Stretch with Trunk Bend  - 2 x daily - 7 x weekly - 1 sets - 3 reps - 30-60 sec  hold    ASSESSMENT:  CLINICAL IMPRESSION: Keionna was very challenged with 1/2 kneel exercises on foam today, but was able to maintain her balance independently. Afterward, she had more difficulty with both split stance balance and SLS with reach requiring kickstand position today. She reported tightness in her quads and HS and responded well to stretches in general. She did report she needed to stretch more, so she still isn't fully  compliant with HEP.   OBJECTIVE IMPAIRMENTS: Abnormal gait, decreased balance, decreased strength, impaired flexibility, and postural dysfunction.   ACTIVITY LIMITATIONS: bending, stairs, transfers, and locomotion level  PARTICIPATION LIMITATIONS: shopping and community activity  PERSONAL FACTORS: Age are also affecting patient's functional outcome.   REHAB POTENTIAL: Excellent  CLINICAL DECISION MAKING: Stable/uncomplicated  EVALUATION COMPLEXITY: Low   GOALS: Goals reviewed with patient? Yes  SHORT TERM GOALS: Target date: 03/17/2023   Patient will be independent with initial HEP. Baseline:  Goal status: IN PROGRESS  2.   Patient will be educated on strategies to decrease risk of falls.  Baseline:  Goal status: MET   LONG TERM GOALS: Target date: 04/14/2023  Patient will be independent with advanced/ongoing HEP to improve outcomes and carryover.  Baseline:  Goal status: INITIAL  2.   Patient will score 54/56 on Berg Balance test to demonstrate lower risk of falls.   Baseline: 50/56 Goal status: INITIAL  3.  Patient will be able to step up/down curb safely with LRAD for safety with community ambulation.  Baseline:  Goal status: INITIAL   4.  Patient will demonstrate improved functional LE strength as demonstrated by ability to perform floor to stand transfer. Baseline:  Goal status: IN PROGRESS   5.  Patient will  demonstrate improved safety with gait by reporting decreased incidence of catching her feet or tripping by 75% or more. Baseline:  Goal status: INITIAL  6.  Patient to report no falls. Baseline:  Goal status: INITIAL     PLAN:  PT FREQUENCY: 2x/week  PT DURATION: 8 weeks  PLANNED INTERVENTIONS: 97110-Therapeutic exercises, 97530- Therapeutic activity, 97112- Neuromuscular re-education, 97535- Self Care, 65784- Manual therapy, [phone]- Gait training, [phone]- Aquatic Therapy, Patient/Family education, Balance training, Stair training, Vestibular  training, and Visual/preceptual remediation/compensation  PLAN FOR NEXT SESSION: work on higher level balance and gait activities; 1/2 kneel and standing core; turning activities, narrow BOS, vestibular assessment if indicated.   Solon Palm, PT  03/16/23, 12:41 PM  [facility] [address], [address] [place], [place] [address] Phone # [phone] Fax [phone]

## 2023-03-20 ENCOUNTER — Encounter: Payer: Medicare Other | Admitting: Physical Therapy

## 2023-03-23 ENCOUNTER — Ambulatory Visit: Payer: Medicare Other | Admitting: Physical Therapy

## 2023-03-23 ENCOUNTER — Telehealth: Payer: Self-pay | Admitting: Physical Therapy

## 2023-03-23 NOTE — Telephone Encounter (Signed)
Phoned patient about missed appointment. She thought she had cancelled it, but that was her 11/18 appt. She will be here on the 25th.

## 2023-03-27 ENCOUNTER — Encounter: Payer: Self-pay | Admitting: Rehabilitative and Restorative Service Providers"

## 2023-03-27 ENCOUNTER — Ambulatory Visit: Payer: Medicare Other | Admitting: Rehabilitative and Restorative Service Providers"

## 2023-03-27 DIAGNOSIS — R2689 Other abnormalities of gait and mobility: Secondary | ICD-10-CM

## 2023-03-27 DIAGNOSIS — R2681 Unsteadiness on feet: Secondary | ICD-10-CM | POA: Diagnosis not present

## 2023-03-27 NOTE — Therapy (Signed)
OUTPATIENT PHYSICAL THERAPY LOWER EXTREMITY TREATMENT   Patient Name: Tabitha Baker MRN: [id number] DOB:November 06, 1950, 72 y.o., female Today's Date: 03/27/2023  END OF SESSION:  PT End of Session - 03/27/23 0940     Visit Number 8    Date for PT Re-Evaluation 04/14/23    Authorization Type MCR KX after 15    Progress Note Due on Visit 10    PT Start Time 0937    PT Stop Time 1015    PT Time Calculation (min) 38 min    Activity Tolerance Patient tolerated treatment well    Behavior During Therapy [facility] for tasks assessed/performed                 Past Medical History:  Diagnosis Date   Age-related osteoporosis without current pathological fracture 03/18/2021   Allergy    Aortic valve disorder 09/01/2020   B-cell lymphoma (HCC)    Benign neoplasm of nasal cavity, middle ear, 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/21/2022   Her nose appears irritated.   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   Hardening of the aorta (main artery of the heart) (HCC) 09/01/2020   Hearing loss    History of squamous cell carcinoma    Hyperlipidemia    Hypothyroidism    Lymphoma in remission 02/21/2022   pt with hx of non hodgkins lymphoma needs f/u with dr Oda Cogan   Mild aortic regurgitation    Mild tricuspid regurgitation    Mixed dyslipidemia 02/17/2020   Non-neoplastic nevus 02/21/2022   Osteoporosis    Pulmonic valve regurgitation 09/01/2020   Sinusitis 02/21/2022   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    UTI (urinary tract infection)    Past Surgical History:  Procedure Laterality Date   ABDOMINAL HYSTERECTOMY     APPENDECTOMY     EYE SURGERY     HERNIA REPAIR     Patient Active Problem List   Diagnosis Date Noted   Benign neoplasm of nasal cavity, middle ear, 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/21/2022   Depression 02/21/2022    Dry eye syndrome 02/21/2022   Lymphoma in remission 02/21/2022   Non-neoplastic nevus 02/21/2022   Sinusitis 02/21/2022   Age-related osteoporosis without current pathological fracture 03/18/2021   Aortic valve disorder 09/01/2020   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9 (ICD-10-CM) - Unspecified abnormalities of gait and mobility  THERAPY DIAG:  Unsteadiness on feet  Other abnormalities of gait and mobility  Rationale for Evaluation and Treatment: Rehabilitation  ONSET DATE: recent months  SUBJECTIVE:   SUBJECTIVE STATEMENT: Pt reports that dizziness  is improving and she is feeling safer on the stairs.  Pt denies any falls, but reports that she is still catching her toe on the carpet.  PERTINENT HISTORY:  Osteopenia, lymphoma remission, turner syndrome (poor spatial/hearing issues) PAIN:  Are you having pain? Yes: NPRS scale: 0-3/10 Pain location: bilateral hips Pain description: aching Aggravating factors: increased movement Relieving factors: rest  PRECAUTIONS: Fall  RED FLAGS: None   WEIGHT BEARING RESTRICTIONS: No  FALLS:  Has patient fallen in last 6 months? Yes. Number of falls 2  LIVING ENVIRONMENT: Lives with: lives with their son Lives in: House/apartment Stairs: Yes: Internal: 16 steps; on right going up  and External: 1 steps; 2 inch step Has following equipment at home:  walking stick  OCCUPATION: retired  PLOF: Independent  PATIENT GOALS: Get my balance better, safe with walking  NEXT MD VISIT: none scheduled  OBJECTIVE:  Note: Objective measures were completed at Evaluation unless otherwise noted.  DIAGNOSTIC FINDINGS: none  PATIENT SURVEYS:  Eval:  ABC scale  1270 / 1600 = 79.4 % 03/27/2023:  Total ABC score: 1280 / 1600 = 80.0 %  COGNITION: Overall cognitive status: Within functional limits for tasks assessed     SENSATION: WFL   MUSCLE LENGTH: Tight B piriformis  POSTURE: flexed trunk   LOWER EXTREMITY ROM: WNL for task assessed  LOWER EXTREMITY MMT:  MMT Right eval Left eval  Hip flexion 4 4+  Hip extension 5 5  Hip abduction 5 5  Hip adduction    Hip internal rotation    Hip external rotation    Knee flexion 4- 5  Knee extension 5 5  Ankle dorsiflexion 5 5  Ankle plantarflexion    Ankle inversion    Ankle eversion     (Blank rows = not tested)  FUNCTIONAL TESTS:  Berg Balance Scale: 50/56 Dynamic Gait Index: 20/24 10/25: SLS R 3 sec, L 7 sec  GAIT: Distance walked: 40 Assistive device utilized: None Level of assistance: Complete Independence Comments: deviates from straight path, quick cadence, wide BOS   TODAY'S TREATMENT:                                                                                                                              DATE:   03/27/2023: Nustep level 5 x6 min with PT present to discuss status ABC scale 80% Seated piriformis stretch 2x20 sec bilat Standing hamstring stretch at stairs 2x20 sec bilat Alt toe tap to 2 cones on floor 2x10 bilat with CGA for stability Farmer's carry with 10# kettlebell x80 ft, then repeat with weight in alt hand Side stepping with curtsey x6 bilat with CGA   03/16/23 Bike L3 x5 min with PT present to discuss status   1/2 kneel chops with 3# on mat  x 10 B on foam pad 1/2  kneel balance with normal  hip width alignment of feet - challenging; then added head turns x 7  Standing  split stance for balance - challenging after 1/2 kneel  Seated hamstring stretch 2x30 sec bilat foot on step Seated piriformis stretch x30 sec bilat (more difficulty with right leg) Standing quad stretch with foot on chair x 30 sec B SLS with kickstand performing touching to cone on PT mat in front of patient 1 x10 bilat CGA  03/13/2023 Nustep level 5 x5 min with PT present to discuss status   fall prevention techniques reviewed with patient Floor to stand transfers on mat - practiced B with and without support of mat table 1/2 kneel chops with weighted blue ball on mat  x 10 B Standing split stance chops with weighted blue ball on mat x 10 B Standing alt toe tap to 3 cones 2x10 with SBA Seated hamstring stretch x30 sec bilat Seated piriformis stretch x30 sec bilat (more difficulty with right leg) SLS performing touching to cone on PT mat in front of patient 2 x10 bilat CGA      PATIENT EDUCATION:  Education details: HEP update  Person educated: Patient Education method: Explanation, Demonstration, and Handouts Education comprehension: verbalized understanding and returned demonstration  HOME EXERCISE PROGRAM: Access Code: [email or web address] URL: [email or web address] Date: 03/09/2023 Prepared by: Raynelle Fanning  Exercises - Standing Romberg to 1/4 Tandem Stance  - 1 x daily - 3-4 x weekly - 1 sets - 10 reps - Standing Single Leg Stance with Unilateral Counter Support  - 2 x daily - 7 x weekly - 1 sets - 10 reps - as long as you can hold - Tandem Stance in Corner  - 1 x daily - 3-4 x weekly - 1 sets - 10 reps - Star Taps  - 1 x daily - 3-4 x weekly - 1 sets - 10 reps - Standing Hip Abduction with Counter Support  - 1 x daily - 3-4 x weekly - 1 sets - 10 reps - Standing Hip Extension with Counter Support  - 1 x daily - 3-4 x weekly - 1 sets - 10 reps - Standing March with  Counter Support  - 1 x daily - 3-4 x weekly - 1 sets - 10 reps - Seated Piriformis Stretch with Trunk Bend  - 2 x daily - 7 x weekly - 1 sets - 3 reps - 30-60 sec  hold    ASSESSMENT:  CLINICAL IMPRESSION: Ms Ruffing presents to skilled PT reporting that she is feeling more secure with some tasks, like the stairs.  Patient with slight improvement with ABC score since initial evaluation.  Patient with most difficulty with standing balance tasks and requires CGA.  Added side curtsey for challenge to balance at barre with patient able to grab, as needed.  Patient continues to require skilled PT to progress towards goal related activities.   OBJECTIVE IMPAIRMENTS: Abnormal gait, decreased balance, decreased strength, impaired flexibility, and postural dysfunction.   ACTIVITY LIMITATIONS: bending, stairs, transfers, and locomotion level  PARTICIPATION LIMITATIONS: shopping and community activity  PERSONAL FACTORS: Age are also affecting patient's functional outcome.   REHAB POTENTIAL: Excellent  CLINICAL DECISION MAKING: Stable/uncomplicated  EVALUATION COMPLEXITY: Low   GOALS: Goals reviewed with patient? Yes  SHORT TERM GOALS: Target date: 03/17/2023   Patient will be independent with initial HEP. Baseline:  Goal status: MET  2.   Patient will be educated on strategies to decrease risk of falls.  Baseline:  Goal status: MET   LONG TERM GOALS: Target date: 04/14/2023  Patient will be independent with advanced/ongoing HEP to improve outcomes and carryover.  Baseline:  Goal status: INITIAL  2.   Patient will score 54/56 on Berg Balance test to demonstrate lower risk of falls.   Baseline: 50/56 Goal status: INITIAL  3.  Patient will be able to step up/down curb safely with LRAD for safety with community ambulation.  Baseline:  Goal status: INITIAL   4.  Patient will demonstrate improved functional LE strength as demonstrated by ability to perform floor to stand  transfer. Baseline:  Goal status: IN PROGRESS   5.  Patient will  demonstrate improved safety with gait by reporting decreased incidence of catching her feet or tripping by 75% or more. Baseline:  Goal status: IN PROGRESS  6.  Patient to report no falls. Baseline:  Goal status: IN PROGRESS     PLAN:  PT FREQUENCY: 2x/week  PT DURATION: 8 weeks  PLANNED INTERVENTIONS: 97110-Therapeutic exercises, 97530- Therapeutic activity, 97112- Neuromuscular re-education, 97535- Self Care, [id number]- Manual therapy, [phone]- Gait training, [phone]- Aquatic Therapy, Patient/Family education, Balance training, Stair training, Vestibular training, and Visual/preceptual remediation/compensation  PLAN FOR NEXT SESSION: work on higher level balance and gait activities; 1/2 kneel and standing core; turning activities, narrow BOS, vestibular assessment if indicated.   Reather Laurence, PT, DPT 03/27/23, 10:24 AM   [facility] [address] [place], [place] [address] Phone # [phone] Fax [phone]

## 2023-03-29 ENCOUNTER — Ambulatory Visit (INDEPENDENT_AMBULATORY_CARE_PROVIDER_SITE_OTHER): Payer: Medicare Other | Admitting: Otolaryngology

## 2023-03-29 ENCOUNTER — Encounter (INDEPENDENT_AMBULATORY_CARE_PROVIDER_SITE_OTHER): Payer: Self-pay

## 2023-03-29 VITALS — Ht <= 58 in | Wt 105.0 lb

## 2023-03-29 DIAGNOSIS — R0981 Nasal congestion: Secondary | ICD-10-CM | POA: Diagnosis not present

## 2023-03-29 DIAGNOSIS — J343 Hypertrophy of nasal turbinates: Secondary | ICD-10-CM

## 2023-03-29 DIAGNOSIS — H608X3 Other otitis externa, bilateral: Secondary | ICD-10-CM

## 2023-03-29 DIAGNOSIS — J310 Chronic rhinitis: Secondary | ICD-10-CM

## 2023-03-29 DIAGNOSIS — H903 Sensorineural hearing loss, bilateral: Secondary | ICD-10-CM

## 2023-03-29 DIAGNOSIS — R0982 Postnasal drip: Secondary | ICD-10-CM | POA: Diagnosis not present

## 2023-03-31 DIAGNOSIS — H608X3 Other otitis externa, bilateral: Secondary | ICD-10-CM | POA: Insufficient documentation

## 2023-03-31 DIAGNOSIS — R0982 Postnasal drip: Secondary | ICD-10-CM | POA: Insufficient documentation

## 2023-03-31 DIAGNOSIS — J343 Hypertrophy of nasal turbinates: Secondary | ICD-10-CM | POA: Insufficient documentation

## 2023-03-31 DIAGNOSIS — J310 Chronic rhinitis: Secondary | ICD-10-CM | POA: Insufficient documentation

## 2023-03-31 DIAGNOSIS — H903 Sensorineural hearing loss, bilateral: Secondary | ICD-10-CM | POA: Insufficient documentation

## 2023-03-31 NOTE — Progress Notes (Signed)
Patient ID: Tabitha Baker, female   DOB: 05-18-1950, 72 y.o.   MRN: [id number]  Follow-up: Recurrent sinusitis, postnasal drainage, hearing loss  HPI: The patient is a 72 year old female who presents today for her follow-up evaluation. The patient was previously seen for hearing loss, recurrent sinusitis, and postnasal drainage.  She was noted to have nasal mucosal congestion and bilateral inferior turbinate hypertrophy.  She was treated with Flonase and Atrovent nasal sprays and nasal saline irrigation.  The patient returns today reporting improvement in her nasal symptoms.  She still has occasional postnasal drainage.  At her last visit, she was also noted to have bilateral high-frequency sensorineural hearing loss.  She currently wears bilateral hearing aids.  She denies any significant change in her hearing.  However, she complains of frequent itchy sensation in her ears.  She denies any otalgia, otorrhea, or vertigo.  Exam: General: Communicates without difficulty, well nourished, no acute distress. Head: Normocephalic, no evidence injury, no tenderness, facial buttresses intact without stepoff. Face/sinus: No tenderness to palpation and percussion. Facial movement is normal and symmetric. Eyes: PERRL, EOMI. No scleral icterus, conjunctivae clear. Neuro: CN II exam reveals vision grossly intact.  No nystagmus at any point of gaze. Ears: Auricles well formed without lesions.  Ear canals are intact without mass or lesion. Eczematous changes are noted bilaterally.The patient's tympanic membranes are intact bilaterally.  Both are mildly retracted.  No middle ear effusion or acute infection is noted. Nose: External evaluation reveals normal support and skin without lesions.  Dorsum is intact.  Anterior rhinoscopy reveals congested mucosa over anterior aspect of inferior turbinates and intact septum.  No purulence noted. Oral:  Oral cavity and oropharynx are intact, symmetric, without erythema or edema.   Mucosa is moist without lesions. Neck: Full range of motion without pain.  There is no significant lymphadenopathy.  No masses palpable.  Thyroid bed within normal limits to palpation.  Parotid glands and submandibular glands equal bilaterally without mass.  Trachea is midline. Neuro:  CN 2-12 grossly intact. Gait normal.   Procedure:  Flexible Nasal Endoscopy: Description: Risks, benefits, and alternatives of flexible endoscopy were explained to the patient.  Specific mention was made of the risk of throat numbness with difficulty swallowing, possible bleeding from the nose and mouth, and pain from the procedure.  The patient gave oral consent to proceed.  The flexible scope was inserted into the right nasal cavity.  Endoscopy of the interior nasal cavity, superior, inferior, and middle meatus was performed. The sphenoid-ethmoid recess was examined. Edematous mucosa was noted. Sinus openings were widely patent. No polyp, mass, or lesion was appreciated. Olfactory cleft was clear.  Nasopharynx with postnasal drainage.  Turbinates were well healed.  The procedure was repeated on the contralateral side with similar findings.  The patient tolerated the procedure well.   Assessment  1.  Chronic rhinitis with nasal mucosal congestion, chronic postnasal drainage, and bilateral inferior turbinate hypertrophy.  The severity of her nasal congestion has decreased. 2.  No acute infection is noted today. 3.  Subjectively stable bilateral high-frequency sensorineural hearing loss. 4.  Bilateral chronic eczematous otitis externa.  Plan  1.  The physical exam and nasal endoscopy findings are reviewed with the patient.    2.  Elocon cream to treat the eczematous otitis externa. 3.  Continue with Flonase nasal spray 2 sprays each nostril daily.  Atrovent nasal spray to treat her postnasal drainage. 4.  Continue the use of bilateral hearing aids.   5.  The patient will return for re-evaluation in 6 months.

## 2023-04-02 NOTE — Therapy (Signed)
OUTPATIENT PHYSICAL THERAPY LOWER EXTREMITY TREATMENT   Patient Name: Tabitha Baker MRN: [id number] DOB:December 09, 1950, 72 y.o., female Today's Date: 04/03/2023  END OF SESSION:  PT End of Session - 04/03/23 0931     Visit Number 9    Date for PT Re-Evaluation 04/14/23    Authorization Type MCR KX after 15    Progress Note Due on Visit 10    PT Start Time 0932    PT Stop Time 1015    PT Time Calculation (min) 43 min    Activity Tolerance Patient tolerated treatment well    Behavior During Therapy [facility] for tasks assessed/performed                  Past Medical History:  Diagnosis Date   Age-related osteoporosis without current pathological fracture 03/18/2021   Allergy    Aortic valve disorder 09/01/2020   B-cell lymphoma (HCC)    Benign neoplasm of nasal cavity, middle ear, 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/21/2022   Her nose appears irritated.   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   Hardening of the aorta (main artery of the heart) (HCC) 09/01/2020   Hearing loss    History of squamous cell carcinoma    Hyperlipidemia    Hypothyroidism    Lymphoma in remission 02/21/2022   pt with hx of non hodgkins lymphoma needs f/u with dr Oda Cogan   Mild aortic regurgitation    Mild tricuspid regurgitation    Mixed dyslipidemia 02/17/2020   Non-neoplastic nevus 02/21/2022   Osteoporosis    Pulmonic valve regurgitation 09/01/2020   Sinusitis 02/21/2022   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    UTI (urinary tract infection)    Past Surgical History:  Procedure Laterality Date   ABDOMINAL HYSTERECTOMY     APPENDECTOMY     EYE SURGERY     HERNIA REPAIR     Patient Active Problem List   Diagnosis Date Noted   Hypertrophy of nasal turbinates 03/31/2023   Chronic rhinitis 03/31/2023   Postnasal drip 03/31/2023    Sensorineural hearing loss, bilateral 03/31/2023   Chronic eczematous otitis externa of both ears 03/31/2023   Benign neoplasm of nasal cavity, middle ear, 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/21/2022   Depression 02/21/2022   Dry eye syndrome 02/21/2022   Lymphoma in remission 02/21/2022   Non-neoplastic nevus 02/21/2022   Sinusitis 02/21/2022   Age-related osteoporosis without current pathological fracture 03/18/2021   Aortic valve disorder 09/01/2020   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9 (ICD-10-CM) - Unspecified abnormalities of gait and mobility  THERAPY DIAG:  Unsteadiness on feet  Other abnormalities of gait and mobility  Rationale for Evaluation and Treatment: Rehabilitation  ONSET DATE: recent months  SUBJECTIVE:   SUBJECTIVE STATEMENT: I've been working on my homework.  PERTINENT HISTORY:  Osteopenia, lymphoma remission, turner syndrome (poor spatial/hearing issues) PAIN:  Are you having pain? Yes: NPRS scale: 0-3/10 Pain location: bilateral hips Pain description: aching Aggravating factors: increased movement Relieving factors: rest  PRECAUTIONS: Fall  RED FLAGS: None   WEIGHT BEARING RESTRICTIONS: No  FALLS:  Has patient fallen in last 6 months? Yes. Number of falls 2  LIVING ENVIRONMENT: Lives with: lives  with their son Lives in: House/apartment Stairs: Yes: Internal: 16 steps; on right going up and External: 1 steps; 2 inch step Has following equipment at home:  walking stick  OCCUPATION: retired  PLOF: Independent  PATIENT GOALS: Get my balance better, safe with walking  NEXT MD VISIT: none scheduled  OBJECTIVE:  Note: Objective measures were completed at Evaluation unless otherwise noted.  DIAGNOSTIC FINDINGS: none  PATIENT SURVEYS:  Eval:  ABC scale  1270 / 1600 = 79.4 % 03/27/2023:  Total ABC score: 1280 / 1600 = 80.0 %  COGNITION: Overall cognitive status: Within functional limits for tasks assessed     SENSATION: WFL   MUSCLE LENGTH: Tight B piriformis  POSTURE: flexed trunk   LOWER EXTREMITY ROM: WNL for task assessed  LOWER EXTREMITY MMT:  MMT Right eval Left eval  Hip flexion 4 4+  Hip extension 5 5  Hip abduction 5 5  Hip adduction    Hip internal rotation    Hip external rotation    Knee flexion 4- 5  Knee extension 5 5  Ankle dorsiflexion 5 5  Ankle plantarflexion    Ankle inversion    Ankle eversion     (Blank rows = not tested)  FUNCTIONAL TESTS:  Berg Balance Scale: 50/56 Dynamic Gait Index: 20/24 10/25: SLS R 3 sec, L 7 sec  GAIT: Distance walked: 40 Assistive device utilized: None Level of assistance: Complete Independence Comments: deviates from straight path, quick cadence, wide BOS   TODAY'S TREATMENT:                                                                                                                              DATE:   04/23/2023: Nustep level 5 x6 min with PT present to discuss status Seated fig 4 2x30 sec  Seated HS stretch 2x30 sec SLS multiple reps Alt toe tap to 2 cones on floor 2x5 bilat with CGA for stability SLS with kickstand performing touching to cone on PT mat in front of patient 2 x10 Side stepping with curtsy 4 x 5 steps B bilat with CGA   Standing split stance with one foot on 6 inch step,  rotations with 2# ball  x 10 B   03/27/2023: Nustep level 5 x6 min with PT present to discuss status ABC scale 80%  Seated piriformis stretch 2x20 sec bilat Standing hamstring stretch at stairs 2x20 sec bilat Alt toe tap to 2 cones on floor 2x10 bilat with CGA for stability Farmer's carry with 10# kettlebell x80 ft, then repeat with weight in alt hand Side stepping with curtsey x6 bilat with CGA   03/16/23 Bike L3 x5 min with PT present to discuss status   1/2 kneel chops with 3# on mat  x 10 B on foam pad 1/2 kneel balance with normal  hip width alignment of feet - challenging; then added head turns x 7  Standing split stance for balance - challenging after 1/2 kneel  Seated hamstring stretch 2x30 sec bilat foot on step Seated piriformis stretch x30 sec bilat (more difficulty with right leg) Standing quad stretch with foot on chair x 30 sec B SLS with kickstand performing touching to cone on PT mat in front of patient 1 x10 bilat CGA  03/13/2023 Nustep level 5 x5 min with PT present to discuss status   fall prevention techniques reviewed with patient Floor to stand transfers on mat - practiced B with and without support of mat table 1/2 kneel chops with weighted blue ball on mat  x 10 B Standing split stance chops with weighted blue ball on mat x 10 B Standing alt toe tap to 3 cones 2x10 with SBA Seated hamstring stretch x30 sec bilat Seated piriformis stretch x30 sec bilat (more difficulty with right leg) SLS performing touching to cone on PT mat in front of patient 2 x10 bilat CGA      PATIENT EDUCATION:  Education details: HEP update  Person educated: Patient Education method: Explanation, Demonstration, and Handouts Education comprehension: verbalized understanding and returned demonstration  HOME EXERCISE PROGRAM: Access Code: [email or web address] URL: [email or web address] Date: 04/03/2023 Prepared by: Raynelle Fanning  Exercises - Standing Romberg to 1/4 Tandem  Stance  - 1 x daily - 3-4 x weekly - 1 sets - 10 reps - Standing Single Leg Stance with Unilateral Counter Support  - 2 x daily - 7 x weekly - 1 sets - 10 reps - as long as you can hold - Tandem Stance in Corner  - 1 x daily - 3-4 x weekly - 1 sets - 10 reps - Star Taps  - 1 x daily - 3-4 x weekly - 1 sets - 10 reps - Standing Hip Abduction with Counter Support  - 1 x daily - 3-4 x weekly - 1 sets - 10 reps - Standing Hip Extension with Counter Support  - 1 x daily - 3-4 x weekly - 1 sets - 10 reps - Standing March with Counter Support  - 1 x daily - 3-4 x weekly - 1 sets - 10 reps - Seated Piriformis Stretch with Trunk Bend  - 2 x daily - 7 x weekly - 1 sets - 3 reps - 30-60 sec  hold - Quadricep Stretch with Chair and Counter Support  - 1 x daily - 7 x weekly - 1 sets - 3 reps - 30-60 sec hold - Curtsy Lunge to Lateral Lunge with TRX  - 1 x daily - 3 x weekly - 2 sets - 4-5 reps    ASSESSMENT:  CLINICAL IMPRESSION: Jazmen did well with both curtsy side steps and single leg reaches. The latter is still challenging when we moved her backward some requiring her to reach and bend further. SLS activities are hardest when standing on the left LE.   OBJECTIVE IMPAIRMENTS: Abnormal gait, decreased balance, decreased  strength, impaired flexibility, and postural dysfunction.   ACTIVITY LIMITATIONS: bending, stairs, transfers, and locomotion level  PARTICIPATION LIMITATIONS: shopping and community activity  PERSONAL FACTORS: Age are also affecting patient's functional outcome.   REHAB POTENTIAL: Excellent  CLINICAL DECISION MAKING: Stable/uncomplicated  EVALUATION COMPLEXITY: Low   GOALS: Goals reviewed with patient? Yes  SHORT TERM GOALS: Target date: 03/17/2023   Patient will be independent with initial HEP. Baseline:  Goal status: MET  2.   Patient will be educated on strategies to decrease risk of falls.  Baseline:  Goal status: MET   LONG TERM GOALS: Target date:  04/14/2023  Patient will be independent with advanced/ongoing HEP to improve outcomes and carryover.  Baseline:  Goal status: INITIAL  2.   Patient will score 54/56 on Berg Balance test to demonstrate lower risk of falls.   Baseline: 50/56 Goal status: INITIAL  3.  Patient will be able to step up/down curb safely with LRAD for safety with community ambulation.  Baseline:  Goal status: INITIAL   4.  Patient will demonstrate improved functional LE strength as demonstrated by ability to perform floor to stand transfer. Baseline:  Goal status: IN PROGRESS   5.  Patient will  demonstrate improved safety with gait by reporting decreased incidence of catching her feet or tripping by 75% or more. Baseline:  Goal status: IN PROGRESS  6.  Patient to report no falls. Baseline:  Goal status: IN PROGRESS     PLAN:  PT FREQUENCY: 2x/week  PT DURATION: 8 weeks  PLANNED INTERVENTIONS: 97110-Therapeutic exercises, 97530- Therapeutic activity, 97112- Neuromuscular re-education, 97535- Self Care, [id number]- Manual therapy, [phone]- Gait training, [phone]- Aquatic Therapy, Patient/Family education, Balance training, Stair training, Vestibular training, and Visual/preceptual remediation/compensation  PLAN FOR NEXT SESSION: 10th visit note, work on higher level balance and gait activities; 1/2 kneel and standing core; turning activities, narrow BOS, vestibular assessment if indicated.   Solon Palm, PT  04/03/23, 10:19 AM   [facility] [address], [address] [place], [place] [address] Phone # [phone] Fax [phone]

## 2023-04-03 ENCOUNTER — Ambulatory Visit: Payer: Medicare Other | Attending: Physician Assistant | Admitting: Physical Therapy

## 2023-04-03 ENCOUNTER — Encounter: Payer: Self-pay | Admitting: Physical Therapy

## 2023-04-03 DIAGNOSIS — R2689 Other abnormalities of gait and mobility: Secondary | ICD-10-CM | POA: Insufficient documentation

## 2023-04-03 DIAGNOSIS — R2681 Unsteadiness on feet: Secondary | ICD-10-CM | POA: Insufficient documentation

## 2023-04-06 ENCOUNTER — Encounter: Payer: Medicare Other | Admitting: Physical Therapy

## 2023-04-09 NOTE — Therapy (Signed)
OUTPATIENT PHYSICAL THERAPY LOWER EXTREMITY TREATMENT AND PROGRESS NOTE  Reporting Period 02/17/23 to 04/10/23   See note below for Objective Data and Assessment of Progress/Goals.    Patient Name: Tabitha Baker MRN: [id number] DOB:24-Nov-1950, 72 y.o., female Today's Date: 04/10/2023  END OF SESSION:  PT End of Session - 04/10/23 0933     Visit Number 10    Date for PT Re-Evaluation 04/14/23    Authorization Type MCR KX after 15    Progress Note Due on Visit 10    PT Start Time 0931    PT Stop Time 1014    PT Time Calculation (min) 43 min    Activity Tolerance Patient tolerated treatment well    Behavior During Therapy [facility] for tasks assessed/performed                   Past Medical History:  Diagnosis Date   Age-related osteoporosis without current pathological fracture 03/18/2021   Allergy    Aortic valve disorder 09/01/2020   B-cell lymphoma (HCC)    Benign neoplasm of nasal cavity, middle ear, 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/21/2022   Her nose appears irritated.   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   Hardening of the aorta (main artery of the heart) (HCC) 09/01/2020   Hearing loss    History of squamous cell carcinoma    Hyperlipidemia    Hypothyroidism    Lymphoma in remission 02/21/2022   pt with hx of non hodgkins lymphoma needs f/u with dr Oda Cogan   Mild aortic regurgitation    Mild tricuspid regurgitation    Mixed dyslipidemia 02/17/2020   Non-neoplastic nevus 02/21/2022   Osteoporosis    Pulmonic valve regurgitation 09/01/2020   Sinusitis 02/21/2022   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    UTI (urinary tract infection)    Past Surgical History:  Procedure Laterality Date   ABDOMINAL HYSTERECTOMY     APPENDECTOMY     EYE SURGERY     HERNIA REPAIR     Patient Active Problem  List   Diagnosis Date Noted   Hypertrophy of nasal turbinates 03/31/2023   Chronic rhinitis 03/31/2023   Postnasal drip 03/31/2023   Sensorineural hearing loss, bilateral 03/31/2023   Chronic eczematous otitis externa of both ears 03/31/2023   Benign neoplasm of nasal cavity, middle ear, 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/21/2022   Depression 02/21/2022   Dry eye syndrome 02/21/2022   Lymphoma in remission 02/21/2022   Non-neoplastic nevus 02/21/2022   Sinusitis 02/21/2022   Age-related osteoporosis without current pathological fracture 03/18/2021   Aortic valve disorder 09/01/2020   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   Hardening of the aorta (main artery of the heart) (HCC) 09/01/2020   Pulmonic valve regurgitation 09/01/2020   DOE (dyspnea on exertion) 02/17/2020   Mixed dyslipidemia 02/17/2020   Allergy    B-cell lymphoma (HCC)    Cancer (HCC)    Cataract    Hearing loss    History of squamous cell carcinoma    Hyperlipidemia    Drug-induced hypothyroidism    Mild aortic regurgitation    Mild tricuspid regurgitation    Osteoporosis    Hypothyroidism    Turner syndrome with mosaicism    UTI (urinary tract infection)     PCP: Delma Officer,  PA   REFERRING PROVIDER: Delma Officer, PA   REFERRING DIAG: R26.9 (ICD-10-CM) - Unspecified abnormalities of gait and mobility  THERAPY DIAG:  Unsteadiness on feet  Other abnormalities of gait and mobility  Rationale for Evaluation and Treatment: Rehabilitation  ONSET DATE: recent months  SUBJECTIVE:   SUBJECTIVE STATEMENT: I am doing better. Stairs are getting easier.   PERTINENT HISTORY:  Osteopenia, lymphoma remission, turner syndrome (poor spatial/hearing issues) PAIN:  Are you having pain? Yes: NPRS scale: 0-3/10 Pain location: bilateral hips Pain description: aching Aggravating factors: increased movement Relieving factors: rest  PRECAUTIONS: Fall  RED  FLAGS: None   WEIGHT BEARING RESTRICTIONS: No  FALLS:  Has patient fallen in last 6 months? Yes. Number of falls 2  LIVING ENVIRONMENT: Lives with: lives with their son Lives in: House/apartment Stairs: Yes: Internal: 16 steps; on right going up and External: 1 steps; 2 inch step Has following equipment at home:  walking stick  OCCUPATION: retired  PLOF: Independent  PATIENT GOALS: Get my balance better, safe with walking  NEXT MD VISIT: none scheduled  OBJECTIVE:  Note: Objective measures were completed at Evaluation unless otherwise noted.  DIAGNOSTIC FINDINGS: none  PATIENT SURVEYS:  Eval:  ABC scale  1270 / 1600 = 79.4 % 03/27/2023:  Total ABC score: 1280 / 1600 = 80.0 %  COGNITION: Overall cognitive status: Within functional limits for tasks assessed     SENSATION: WFL   MUSCLE LENGTH: Tight B piriformis  POSTURE: flexed trunk   LOWER EXTREMITY ROM: WNL for task assessed  LOWER EXTREMITY MMT:  MMT Right eval Left eval  Hip flexion 4 4+  Hip extension 5 5  Hip abduction 5 5  Hip adduction    Hip internal rotation    Hip external rotation    Knee flexion 4- 5  Knee extension 5 5  Ankle dorsiflexion 5 5  Ankle plantarflexion    Ankle inversion    Ankle eversion     (Blank rows = not tested)  FUNCTIONAL TESTS:  Berg Balance Scale: 50/56 Dynamic Gait Index: 20/24 10/25: SLS R 3 sec, L 7 sec  12/9 BERG 55/56 12/9 SLS R 10 sec, L 13 sec    GAIT: Distance walked: 40 Assistive device utilized: None Level of assistance: Complete Independence Comments: deviates from straight path, quick cadence, wide BOS   TODAY'S TREATMENT:                                                                                                                              DATE:   04/10/2023: Nustep level 5 x6 min with PT present to discuss status Seated fig 4 2x30 sec  Seated HS stretch 2x30 sec BERG 55/56 Curb - practiced in parking lot. No problem Floor to  stand transfer - pt I getting onto and up from the floor.  Step ups/downs x 5 B on 6 and 8 inch step Standing toe taps to 8 inch  x 2 x 20 - cues to go slower and at a steady pace Heel raises at counter x 20 cues to avoid inversion Calf stretch x 20 sec ea  04/03/2023: Nustep level 5 x6 min with PT present to discuss status Seated fig 4 2x30 sec  Seated HS stretch 2x30 sec SLS multiple reps Alt toe tap to 2 cones on floor 2x5 bilat with CGA for stability SLS with kickstand performing touching to cone on PT mat in front of patient 2 x10 Side stepping with curtsy 4 x 5 steps B bilat with CGA   Standing split stance with one foot on 6 inch step, rotations with 2# ball  x 10 B   03/27/2023: Nustep level 5 x6 min with PT present to discuss status ABC scale 80% Seated piriformis stretch 2x20 sec bilat Standing hamstring stretch at stairs 2x20 sec bilat Alt toe tap to 2 cones on floor 2x10 bilat with CGA for stability Farmer's carry with 10# kettlebell x80 ft, then repeat with weight in alt hand Side stepping with curtsey x6 bilat with CGA   03/16/23 Bike L3 x5 min with PT present to discuss status   1/2 kneel chops with 3# on mat  x 10 B on foam pad 1/2 kneel balance with normal  hip width alignment of feet - challenging; then added head turns x 7  Standing split stance for balance - challenging after 1/2 kneel  Seated hamstring stretch 2x30 sec bilat foot on step Seated piriformis stretch x30 sec bilat (more difficulty with right leg) Standing quad stretch with foot on chair x 30 sec B SLS with kickstand performing touching to cone on PT mat in front of patient 1 x10 bilat CGA   PATIENT EDUCATION:  Education details: HEP update  Person educated: Patient Education method: Explanation, Demonstration, and Handouts Education comprehension: verbalized understanding and returned demonstration  HOME EXERCISE PROGRAM: Access Code: [email or web address] URL:  [email or web address] Date: 04/03/2023 Prepared by: Raynelle Fanning  Exercises - Standing Romberg to 1/4 Tandem Stance  - 1 x daily - 3-4 x weekly - 1 sets - 10 reps - Standing Single Leg Stance with Unilateral Counter Support  - 2 x daily - 7 x weekly - 1 sets - 10 reps - as long as you can hold - Tandem Stance in Corner  - 1 x daily - 3-4 x weekly - 1 sets - 10 reps - Star Taps  - 1 x daily - 3-4 x weekly - 1 sets - 10 reps - Standing Hip Abduction with Counter Support  - 1 x daily - 3-4 x weekly - 1 sets - 10 reps - Standing Hip Extension with Counter Support  - 1 x daily - 3-4 x weekly - 1 sets - 10 reps - Standing March with Counter Support  - 1 x daily - 3-4 x weekly - 1 sets - 10 reps - Seated Piriformis Stretch with Trunk Bend  - 2 x daily - 7 x weekly - 1 sets - 3 reps - 30-60 sec  hold - Theatre manager with Chair and Counter Support  - 1 x daily - 7 x weekly - 1 sets - 3 reps - 30-60 sec hold - Curtsy Lunge to Lateral Lunge with TRX  - 1 x daily - 3 x weekly - 2 sets - 4-5 reps    ASSESSMENT:  CLINICAL IMPRESSION: Tabitha Baker is progressing very well toward her LTGs. She has met her BERG goal, floor to stand transfer/strength goal, curb  goal and has had no falls since starting PT. She states that stairs seem easier and she is catching her R toe less with gait. This still does happen and she states it depends sometimes on floor surface. For instance she catches her toe a lot in doorway transitions. Her SLS has improved significantly B. Tandem stance is still a challenge. She has considerable weakness in her posterior tibialis B and has difficulty maintaining correct form with heel raises. Sukhman continues to demonstrate potential for improvement and would benefit from continued skilled therapy to address impairments.      OBJECTIVE IMPAIRMENTS: Abnormal gait, decreased balance, decreased strength, impaired flexibility, and postural dysfunction.   ACTIVITY LIMITATIONS:  bending, stairs, transfers, and locomotion level  PARTICIPATION LIMITATIONS: shopping and community activity  PERSONAL FACTORS: Age are also affecting patient's functional outcome.   REHAB POTENTIAL: Excellent  CLINICAL DECISION MAKING: Stable/uncomplicated  EVALUATION COMPLEXITY: Low   GOALS: Goals reviewed with patient? Yes  SHORT TERM GOALS: Target date: 03/17/2023   Patient will be independent with initial HEP. Baseline:  Goal status: MET  2.   Patient will be educated on strategies to decrease risk of falls.  Baseline:  Goal status: MET   LONG TERM GOALS: Target date: 04/14/2023  Patient will be independent with advanced/ongoing HEP to improve outcomes and carryover.  Baseline:  Goal status: IN PROGRESS  12/9 needs to do stretching more  2.   Patient will score 54/56 on Berg Balance test to demonstrate lower risk of falls.   Baseline: 50/56 Goal status: MET  3.  Patient will be able to step up/down curb safely with LRAD for safety with community ambulation.  Baseline:  Goal status: MET   4.  Patient will demonstrate improved functional LE strength as demonstrated by ability to perform floor to stand transfer. Baseline:  Goal status: MET   5.  Patient will  demonstrate improved safety with gait by reporting decreased incidence of catching her feet or tripping by 75% or more. Baseline:  Goal status: IN PROGRESS 12/9 50% better  6.  Patient to report no falls. Baseline:  Goal status: IN PROGRESS 12/9 no falls     PLAN:  PT FREQUENCY: 2x/week  PT DURATION: 8 weeks  PLANNED INTERVENTIONS: 97110-Therapeutic exercises, 97530- Therapeutic activity, 97112- Neuromuscular re-education, 97535- Self Care, 16109- Manual therapy, [phone]- Gait training, [phone]- Aquatic Therapy, Patient/Family education, Balance training, Stair training, Vestibular training, and Visual/preceptual remediation/compensation  PLAN FOR NEXT SESSION: continue to focus on foot clearance,  tandem stance, high level balance.   Solon Palm, PT  04/10/23, 4:35 PM   [facility] [address] [place], [place] [address] Phone # [phone] Fax [phone]

## 2023-04-10 ENCOUNTER — Encounter: Payer: Self-pay | Admitting: Physical Therapy

## 2023-04-10 ENCOUNTER — Ambulatory Visit: Payer: Medicare Other | Admitting: Physical Therapy

## 2023-04-10 DIAGNOSIS — R2681 Unsteadiness on feet: Secondary | ICD-10-CM | POA: Diagnosis not present

## 2023-04-10 DIAGNOSIS — R2689 Other abnormalities of gait and mobility: Secondary | ICD-10-CM

## 2023-04-12 NOTE — Therapy (Addendum)
 OUTPATIENT PHYSICAL THERAPY LOWER EXTREMITY TREATMENT  AND DISCHARGE SUMMARY   Patient Name: Tabitha Baker MRN: [id number] DOB:Nov 02, 1950, 72 y.o., female Today's Date: 04/13/2023  END OF SESSION:  PT End of Session - 04/13/23 1402     Visit Number 11    Date for PT Re-Evaluation 04/14/23    Authorization Type MCR KX after 15    Progress Note Due on Visit 20    PT Start Time 1402    PT Stop Time 1447    PT Time Calculation (min) 45 min    Activity Tolerance Patient tolerated treatment well    Behavior During Therapy [facility] for tasks assessed/performed                    Past Medical History:  Diagnosis Date   Age-related osteoporosis without current pathological fracture 03/18/2021   Allergy    Aortic valve disorder 09/01/2020   B-cell lymphoma (HCC)    Benign neoplasm of nasal cavity, middle ear, 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/21/2022   Her nose appears irritated.   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   Hardening of the aorta (main artery of the heart) (HCC) 09/01/2020   Hearing loss    History of squamous cell carcinoma    Hyperlipidemia    Hypothyroidism    Lymphoma in remission 02/21/2022   pt with hx of non hodgkins lymphoma needs f/u with dr Oda Cogan   Mild aortic regurgitation    Mild tricuspid regurgitation    Mixed dyslipidemia 02/17/2020   Non-neoplastic nevus 02/21/2022   Osteoporosis    Pulmonic valve regurgitation 09/01/2020   Sinusitis 02/21/2022   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    UTI (urinary tract infection)    Past Surgical History:  Procedure Laterality Date   ABDOMINAL HYSTERECTOMY     APPENDECTOMY     EYE SURGERY     HERNIA REPAIR     Patient Active Problem List   Diagnosis Date Noted   Hypertrophy of nasal turbinates 03/31/2023   Chronic rhinitis 03/31/2023    Postnasal drip 03/31/2023   Sensorineural hearing loss, bilateral 03/31/2023   Chronic eczematous otitis externa of both ears 03/31/2023   Benign neoplasm of nasal cavity, middle ear, 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/21/2022   Depression 02/21/2022   Dry eye syndrome 02/21/2022   Lymphoma in remission 02/21/2022   Non-neoplastic nevus 02/21/2022   Sinusitis 02/21/2022   Age-related osteoporosis without current pathological fracture 03/18/2021   Aortic valve disorder 09/01/2020   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9 (ICD-10-CM) - Unspecified  abnormalities of gait and mobility  THERAPY DIAG:  Unsteadiness on feet  Other abnormalities of gait and mobility  Rationale for Evaluation and Treatment: Rehabilitation  ONSET DATE: recent months  SUBJECTIVE:   SUBJECTIVE STATEMENT: I think I'm doing pretty well. I'd like to wait a while to come back. I caught my toe at the church last night.  PERTINENT HISTORY:  Osteopenia, lymphoma remission, turner syndrome (poor spatial/hearing issues) PAIN:  Are you having pain? Yes: NPRS scale: 0-3/10 Pain location: bilateral hips Pain description: aching Aggravating factors: increased movement Relieving factors: rest  PRECAUTIONS: Fall  RED FLAGS: None   WEIGHT BEARING RESTRICTIONS: No  FALLS:   Has patient fallen in last 6 months? Yes. Number of falls 2  LIVING ENVIRONMENT: Lives with: lives with their son Lives in: House/apartment Stairs: Yes: Internal: 16 steps; on right going up and External: 1 steps; 2 inch step Has following equipment at home:  walking stick  OCCUPATION: retired  PLOF: Independent  PATIENT GOALS: Get my balance better, safe with walking  NEXT MD VISIT: none scheduled  OBJECTIVE:  Note: Objective measures were completed at Evaluation unless otherwise noted.  DIAGNOSTIC FINDINGS: none  PATIENT SURVEYS:  Eval:  ABC scale  1270 / 1600 = 79.4 % 03/27/2023:  Total ABC score: 1280 / 1600 = 80.0 %  COGNITION: Overall cognitive status: Within functional limits for tasks assessed     SENSATION: WFL   MUSCLE LENGTH: Tight B piriformis  POSTURE: flexed trunk   LOWER EXTREMITY ROM: WNL for task assessed  LOWER EXTREMITY MMT:  MMT Right eval Left eval  Hip flexion 4 4+  Hip extension 5 5  Hip abduction 5 5  Hip adduction    Hip internal rotation    Hip external rotation    Knee flexion 4- 5  Knee extension 5 5  Ankle dorsiflexion 5 5  Ankle plantarflexion    Ankle inversion    Ankle eversion     (Blank rows = not tested)  FUNCTIONAL TESTS:  Berg Balance Scale: 50/56 Dynamic Gait Index: 20/24 10/25: SLS R 3 sec, L 7 sec  12/9 BERG 55/56 12/9 SLS R 10 sec, L 13 sec    GAIT: Distance walked: 40 Assistive device utilized: None Level of assistance: Complete Independence Comments: deviates from straight path, quick cadence, wide BOS   TODAY'S TREATMENT:                                                                                                                              DATE:   04/13/2023: Bike L2 x 6 min with PT present to discuss status    Calf stretch x 20 sec ea Seated fig 4 2x30 sec  Seated HS stretch 2x30 sec Heel raises at counter x 20 cues to avoid inversion Step curtsy x 10 B  Tandem stance B 30 sec each  then tandem walking x 4  SLS with kickstand performing touching to cone  on PT mat in front of patient 1 x10 Walking over hurdles 5 x 4 reps for working on clearance Box steps at [employer] (in/in/out/out)  04/10/2023: Nustep level 5 x6 min with PT present to discuss status Seated fig 4 2x30 sec  Seated HS stretch 2x30 sec BERG 55/56 Curb - practiced in parking lot. No problem Floor to stand transfer - pt I getting onto and up from the floor.  Step ups/downs x 5 B on 6 and 8 inch step Standing toe taps to 8 inch x 2 x 20 - cues to go slower and at a steady pace Heel raises at counter x 20 cues to avoid inversion Calf stretch x 20 sec ea  04/03/2023: Nustep level 5 x6 min with PT present to discuss status Seated fig 4 2x30 sec  Seated HS stretch 2x30 sec SLS multiple reps Alt toe tap to 2 cones on floor 2x5 bilat with CGA for stability SLS with kickstand performing touching to cone on PT mat in front of patient 2 x10 Side stepping with curtsy 4 x 5 steps B bilat with CGA   Standing split stance with one foot on 6 inch step, rotations with 2# ball  x 10 B   PATIENT EDUCATION:  Education details: HEP update  Person educated: Patient Education method: Explanation, Demonstration, and Handouts Education comprehension: verbalized understanding and returned demonstration  HOME EXERCISE PROGRAM: Access Code: [email or web address] URL: [email or web address] Date: 04/03/2023 Prepared by: Raynelle Fanning  Exercises - Standing Romberg to 1/4 Tandem Stance  - 1 x daily - 3-4 x weekly - 1 sets - 10 reps - Standing Single Leg Stance with Unilateral Counter Support  - 2 x daily - 7 x weekly - 1 sets - 10 reps - as long as you can hold - Tandem Stance in Corner  - 1 x daily - 3-4 x weekly - 1 sets - 10 reps - Star Taps  - 1 x daily - 3-4 x weekly - 1 sets - 10 reps - Standing Hip Abduction with Counter Support  - 1 x daily - 3-4 x weekly - 1 sets - 10 reps - Standing Hip Extension with Counter Support  - 1  x daily - 3-4 x weekly - 1 sets - 10 reps - Standing March with Counter Support  - 1 x daily - 3-4 x weekly - 1 sets - 10 reps - Seated Piriformis Stretch with Trunk Bend  - 2 x daily - 7 x weekly - 1 sets - 3 reps - 30-60 sec  hold - Theatre manager with Chair and Counter Support  - 1 x daily - 7 x weekly - 1 sets - 3 reps - 30-60 sec hold - Curtsy Lunge to Lateral Lunge with TRX  - 1 x daily - 3 x weekly - 2 sets - 4-5 reps    ASSESSMENT:  CLINICAL IMPRESSION: Audi has met most of her LTGs and reports 50% improvement in walking. She did catch her foot last night at church and she states that this is one place that the catch consistently happens.  Her SLS has improved significantly B. Tandem stance is still a challenge. Renea is pleased with her current level of function, but would like to be put on hold for 30 days before discharging. HEP was reviewed and she is independent with the exercises, but doesn't fully comply with frequency.     OBJECTIVE IMPAIRMENTS: Abnormal gait, decreased balance, decreased strength, impaired flexibility, and postural dysfunction.   ACTIVITY LIMITATIONS:  bending, stairs, transfers, and locomotion level  PARTICIPATION LIMITATIONS: shopping and community activity  PERSONAL FACTORS: Age are also affecting patient's functional outcome.   REHAB POTENTIAL: Excellent  CLINICAL DECISION MAKING: Stable/uncomplicated  EVALUATION COMPLEXITY: Low   GOALS: Goals reviewed with patient? Yes  SHORT TERM GOALS: Target date: 03/17/2023   Patient will be independent with initial HEP. Baseline:  Goal status: MET  2.   Patient will be educated on strategies to decrease risk of falls.  Baseline:  Goal status: MET   LONG TERM GOALS: Target date: 04/14/2023  Patient will be independent with advanced/ongoing HEP to improve outcomes and carryover.  Baseline:  Goal status: PARTIALLY MET 12/12 needs to do stretching more  2.   Patient will score 54/56 on  Berg Balance test to demonstrate lower risk of falls.   Baseline: 50/56 Goal status: MET  3.  Patient will be able to step up/down curb safely with LRAD for safety with community ambulation.  Baseline:  Goal status: MET   4.  Patient will demonstrate improved functional LE strength as demonstrated by ability to perform floor to stand transfer. Baseline:  Goal status: MET   5.  Patient will  demonstrate improved safety with gait by reporting decreased incidence of catching her feet or tripping by 75% or more. Baseline:  Goal status: NOT MET 12/12 50% better  6.  Patient to report no falls. Baseline:  Goal status: MET 12/12     PLAN:  PT FREQUENCY: 2x/week  PT DURATION: 8 weeks  PLANNED INTERVENTIONS: 97110-Therapeutic exercises, 97530- Therapeutic activity, [id number]- Neuromuscular re-education, 97535- Self Care, [id number]- Manual therapy, [phone]- Gait training, [phone]- Aquatic Therapy, Patient/Family education, Balance training, Stair training, Vestibular training, and Visual/preceptual remediation/compensation  PLAN FOR NEXT SESSION:  Hold until 05/13/23 then discharge if pt has not returned.   Solon Palm, PT  04/13/23, 2:56 PM   [facility] [address] [place], [place] [address] Phone # [phone] Fax [phone]  PHYSICAL THERAPY DISCHARGE SUMMARY  Visits from Start of Care: 11  Current functional level related to goals / functional outcomes: See above   Remaining deficits: See above   Education / Equipment: HEP   Patient agrees to discharge. Patient goals were partially met. Patient is being discharged due to being pleased with the current functional level.  Solon Palm, PT 07/21/23 7:58 AM [facility] [address], [address] [place], [place] [address] Phone # [phone] Fax [phone]

## 2023-04-13 ENCOUNTER — Ambulatory Visit: Payer: Medicare Other | Admitting: Physical Therapy

## 2023-04-13 ENCOUNTER — Encounter: Payer: Self-pay | Admitting: Physical Therapy

## 2023-04-13 DIAGNOSIS — R2681 Unsteadiness on feet: Secondary | ICD-10-CM

## 2023-04-13 DIAGNOSIS — R2689 Other abnormalities of gait and mobility: Secondary | ICD-10-CM

## 2023-07-19 ENCOUNTER — Other Ambulatory Visit: Payer: Self-pay | Admitting: Physician Assistant

## 2023-07-19 ENCOUNTER — Ambulatory Visit
Admission: RE | Admit: 2023-07-19 | Discharge: 2023-07-19 | Disposition: A | Discharge status: Home / Self Care | Source: Ambulatory Visit | Attending: Physician Assistant

## 2023-07-19 DIAGNOSIS — R053 Chronic cough: Secondary | ICD-10-CM

## 2023-08-25 ENCOUNTER — Ambulatory Visit: Admission: EM | Admit: 2023-08-25 | Discharge: 2023-08-25 | Disposition: A | Discharge status: Home / Self Care

## 2023-08-25 DIAGNOSIS — H60391 Other infective otitis externa, right ear: Secondary | ICD-10-CM

## 2023-08-25 DIAGNOSIS — J0140 Acute pansinusitis, unspecified: Secondary | ICD-10-CM

## 2023-08-25 DIAGNOSIS — Z01419 Encounter for gynecological examination (general) (routine) without abnormal findings: Secondary | ICD-10-CM | POA: Insufficient documentation

## 2023-08-25 DIAGNOSIS — D140 Benign neoplasm of middle ear, nasal cavity and accessory sinuses: Secondary | ICD-10-CM | POA: Insufficient documentation

## 2023-08-25 DIAGNOSIS — Z7189 Other specified counseling: Secondary | ICD-10-CM | POA: Insufficient documentation

## 2023-08-25 MED ORDER — AMOXICILLIN-POT CLAVULANATE 875-125 MG PO TABS: 1.0000 | ORAL_TABLET | Freq: Two times a day (BID) | ORAL | 0 refills | Status: AC

## 2023-08-25 NOTE — ED Triage Notes (Signed)
"  My right ear is hurting, they have fluid behind them causing me to be dizzy". "I have had this before and need them checked again". "Also some scratchy throat, congestion". Recent Allergy medication changes "but this pollen/allergies is flaring up my sinus's and making my ear hurt". No chest pain. No sob.

## 2023-08-25 NOTE — ED Provider Notes (Signed)
 [name]    CSN: [id number] Arrival date & time: 08/25/23  6962      History   Chief Complaint Chief Complaint  Patient presents with   Ear Problem   Nasal Congestion   Dizziness    HPI Tabitha Baker is a 73 y.o. female.  Patient here today with 3 weeks of ear fullness, right ear pain, dizziness today due to congestion and sinus pressure, postnasal drainage and at present not coughing.  Patient reports 3 weeks ago her doctor changed her allergy medication and she knows initially was having allergy symptoms however given the symptoms have worsened over the last 3 weeks she is concerned for possible sinus infection. She is followed by ENT however was unable to get a visit today.  Reports no recent antibiotic use.  She has been diligently using Flonase however her symptoms have not improved.  She has not had a fever and no known sick exposures.   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   Hardening of the aorta (main artery of the heart) (HCC) 09/01/2020   Hearing loss    History of squamous cell carcinoma    Hyperlipidemia    Hypothyroidism    Lymphoma in remission 02/21/2022   pt with hx of non hodgkins lymphoma needs f/u with dr Camillo Celestine   Mild aortic regurgitation    Mild tricuspid regurgitation    Mixed dyslipidemia 02/17/2020   Non-neoplastic nevus 02/21/2022   Osteoporosis    Pulmonic valve regurgitation 09/01/2020   Sinusitis 02/21/2022   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    UTI (urinary  tract infection)     Patient Active Problem List   Diagnosis Date Noted   Benign neoplasm of septum of nose 08/25/2023   Encounter for breast self examination education 08/25/2023   Encounter for cervical Pap smear with pelvic exam 08/25/2023   Hypertrophy of nasal turbinates 03/31/2023   Chronic rhinitis 03/31/2023   Postnasal drip 03/31/2023   Sensorineural hearing loss, bilateral 03/31/2023   Chronic eczematous otitis externa of both ears 03/31/2023   Benign neoplasm of nasal cavity, middle ear, 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/21/2022   Depression 02/21/2022   Dry eye syndrome 02/21/2022   Lymphoma in remission 02/21/2022   Non-neoplastic nevus 02/21/2022   Sinusitis 02/21/2022   Age-related osteoporosis without current pathological fracture 03/18/2021   Aortic valve disorder 09/01/2020   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   Hardening of the aorta (main artery of the heart) (HCC) 09/01/2020   Pulmonic valve regurgitation 09/01/2020   DOE (dyspnea on exertion) 02/17/2020   Mixed dyslipidemia 02/17/2020   Allergy    B-cell lymphoma (HCC)    Cancer (HCC)    Cataract    Hearing loss    History of squamous cell carcinoma    Hyperlipidemia    Drug-induced hypothyroidism    Mild aortic regurgitation    Mild tricuspid regurgitation    Osteoporosis    Hypothyroidism    Turner syndrome with mosaicism  UTI (urinary tract infection)     Past Surgical History:  Procedure Laterality Date   ABDOMINAL HYSTERECTOMY     APPENDECTOMY     EYE SURGERY     HERNIA REPAIR      OB History   No obstetric history on file.      Home Medications    Prior to Admission medications   Medication Sig Start Date End Date Taking? Authorizing Provider  amoxicillin -clavulanate (AUGMENTIN ) 875-125 MG tablet Take 1 tablet by mouth every 12 (twelve) hours. 08/25/23  Yes Buena Carmine, NP  atomoxetine (STRATTERA) 25 MG capsule Take 25 mg by mouth daily. 06/27/22   Yes [provider]  buPROPion (WELLBUTRIN XL) 150 MG 24 hr tablet Take 150 mg by mouth daily. 08/08/22  Yes [provider]  ezetimibe (ZETIA) 10 MG tablet Take 10 mg by mouth daily.   Yes [provider]  fexofenadine-pseudoephedrine (ALLEGRA-D 24) 180-240 MG 24 hr tablet Take 1 tablet by mouth daily.   Yes [provider]  fluticasone (FLONASE) 50 MCG/ACT nasal spray Place 1 spray into both nostrils 2 (two) times daily.    Yes [provider]  lamoTRIgine (LAMICTAL XR) 50 MG 24 hour tablet Take 50 mg by mouth daily. 06/06/22  Yes [provider]  levothyroxine (SYNTHROID) 75 MCG tablet Take 75 mcg by mouth daily before breakfast.   Yes [provider]  mometasone (ELOCON) 0.1 % cream Apply 1 Application topically daily as needed. 03/31/23  Yes [provider]  rosuvastatin (CRESTOR) 5 MG tablet Take 5 mg by mouth 3 (three) times a week. 01/13/22  Yes [provider]  viloxazine ER (QELBREE) 200 MG 24 hr capsule Take 200 mg by mouth daily. 09/27/22  Yes [provider]  alendronate (FOSAMAX) 35 MG tablet Take 35 mg by mouth every 7 (seven) days. Take with a full glass of water on an empty stomach.    [provider]  ARIPiprazole (ABILIFY) 2 MG tablet Take 2-4 mg by mouth every morning. 02/22/22   [provider]  Calcium 600-200 MG-UNIT tablet Take 1 tablet by mouth daily.    [provider]  cholecalciferol (VITAMIN D3) 25 MCG (1000 UNIT) tablet Take 1,000 Units by mouth daily.    [provider]  Coenzyme Q10 (CO Q10) 200 MG CAPS Take 200 mg by mouth daily.    [provider]  conjugated estrogens (PREMARIN) vaginal cream Place 1 Applicatorful vaginally once a week.    [provider]  Cranberry-Vitamin C-Vitamin E (CRANBERRY PLUS VITAMIN C) 4200-20-3 MG-MG-UNIT CAPS Take 8,400 mg by mouth in the morning.    [provider]  cycloSPORINE (RESTASIS)  0.05 % ophthalmic emulsion Place 1 drop into both eyes 2 (two) times daily.    [provider]  famotidine (PEPCID) 20 MG tablet Take 20 mg by mouth at bedtime. 02/15/21   [provider]  FLUoxetine (PROZAC) 40 MG capsule Take 40 mg by mouth daily. 02/15/22   [provider]  Lutein 20 MG CAPS Take 20 mg by mouth daily.    [provider]  Omega-3 Fatty Acids (FISH OIL) 1000 MG CAPS Take 1,000 mg by mouth daily.    [provider]  Red Yeast Rice 600 MG CAPS Take 1,200 mg by mouth 2 (two) times daily.     [provider]  Turmeric 500 MG CAPS Take 1,000 mg by mouth daily.     [provider]    [facility]  History Family History  Problem Relation Age of Onset   Cancer Mother    Hyperlipidemia Mother    Hypertension Mother    Heart disease Father    Stroke Father    Cancer Brother    Heart disease Brother    Hypertension Maternal Grandmother    Stroke Maternal Grandmother    Cancer Maternal Grandfather    Heart disease Maternal Grandfather    Stroke Paternal Grandmother    Cancer Paternal Grandfather     Social History Social History   Tobacco Use   Smoking status: Never   Smokeless tobacco: Never  Vaping Use   Vaping status: Never Used  Substance Use Topics   Alcohol use: No    Alcohol/week: 0.0 standard drinks of alcohol   Drug use: No     Allergies   Codeine, Erythromycin, Fentanyl, Pegfilgrastim, Sulfa antibiotics, and Tape   Review of Systems Review of Systems  Neurological:  Positive for dizziness.     Physical Exam Triage Vital Signs ED Triage Vitals  Encounter Vitals Group     BP 08/25/23 1006 122/73     Systolic BP Percentile --      Diastolic BP Percentile --      Pulse Rate 08/25/23 1006 72     Resp 08/25/23 1006 18     Temp 08/25/23 1006 98 F (36.7 C)     Temp Source 08/25/23 1006 Oral     SpO2 08/25/23 1006 98 %     Weight 08/25/23 1002 104 lb 15 oz (47.6 kg)     Height 08/25/23  1002 4\' 10"  (1.473 m)     Head Circumference --      Peak Flow --      Pain Score 08/25/23 0958 5     Pain Loc --      Pain Education --      Exclude from Growth Chart --    No data found.  Updated Vital Signs BP 122/73 (BP Location: Left Arm)   Pulse 72   Temp 98 F (36.7 C) (Oral)   Resp 18   Ht 4\' 10"  (1.473 m)   Wt 104 lb 15 oz (47.6 kg)   SpO2 98%   BMI 21.93 kg/m   Visual Acuity Right Eye Distance:   Left Eye Distance:   Bilateral Distance:    Right Eye Near:   Left Eye Near:    Bilateral Near:     Physical Exam Vitals reviewed.  Constitutional:      Appearance: Normal appearance.  HENT:     Head: Normocephalic and atraumatic.     Right Ear: Decreased hearing (Wears hearing aids) noted. Tenderness present. A middle ear effusion is present. Tympanic membrane is erythematous.     Left Ear: Ear canal and external ear normal. Decreased hearing (Wears hearing aids) noted. A middle ear effusion is present. Tympanic membrane is not erythematous.     Nose: Congestion and rhinorrhea present.     Mouth/Throat:     Mouth: Mucous membranes are moist. No injury.     Pharynx: Postnasal drip present. No oropharyngeal exudate or uvula swelling.  Cardiovascular:     Rate and Rhythm: Normal rate and regular rhythm.  Pulmonary:     Effort: Pulmonary effort is normal.     Breath sounds: Normal breath sounds.  Musculoskeletal:        General: Normal range of motion.     Cervical back: Normal range of motion and neck supple.  Skin:  General: Skin is warm and dry.  Neurological:     General: No focal deficit present.     Mental Status: She is alert and oriented to person, place, and time.      UC Treatments / Results  Labs (all labs ordered are listed, but only abnormal results are displayed) Labs Reviewed - No data to display  EKG   Radiology No results found.  Procedures Procedures (including critical care time)  Medications Ordered in UC Medications - No  data to display  Initial Impression / Assessment and Plan / UC Course  I have reviewed the triage vital signs and the nursing notes.  Pertinent labs & imaging results that were available during my care of the patient were reviewed by me and considered in my medical decision making (see chart for details).    Acute nonrecurrent pansinusitis with otitis externa in the right ear, treat empirically with Augmentin  twice daily for 7 days.  Patient currently is consistently taking allergy regimen and patient advised that if her symptoms related to outdoor allergens are not improving after taking Allegra for 4 weeks to follow-up with primary care to discuss other alternatives for antihistamine therapy.  Advised to return here for evaluation if her symptoms do not improve. Final Clinical Impressions(s) / UC Diagnoses   Final diagnoses:  Acute non-recurrent pansinusitis  Infective otitis externa of right ear   Discharge Instructions   None    ED Prescriptions     Medication Sig Dispense Auth. Provider   amoxicillin -clavulanate (AUGMENTIN ) 875-125 MG tablet Take 1 tablet by mouth every 12 (twelve) hours. 14 tablet Buena Carmine, NP      PDMP not reviewed this encounter.   Buena Carmine, NP 08/25/23 1027

## 2023-09-22 ENCOUNTER — Encounter (INDEPENDENT_AMBULATORY_CARE_PROVIDER_SITE_OTHER): Payer: Self-pay | Admitting: Otolaryngology

## 2023-09-22 ENCOUNTER — Ambulatory Visit (INDEPENDENT_AMBULATORY_CARE_PROVIDER_SITE_OTHER): Admitting: Otolaryngology

## 2023-09-22 VITALS — BP 117/59 | HR 79

## 2023-09-22 DIAGNOSIS — H903 Sensorineural hearing loss, bilateral: Secondary | ICD-10-CM

## 2023-09-22 DIAGNOSIS — R0981 Nasal congestion: Secondary | ICD-10-CM | POA: Diagnosis not present

## 2023-09-22 DIAGNOSIS — J310 Chronic rhinitis: Secondary | ICD-10-CM

## 2023-09-22 DIAGNOSIS — H608X3 Other otitis externa, bilateral: Secondary | ICD-10-CM | POA: Diagnosis not present

## 2023-09-22 DIAGNOSIS — J343 Hypertrophy of nasal turbinates: Secondary | ICD-10-CM

## 2023-09-22 DIAGNOSIS — H6983 Other specified disorders of Eustachian tube, bilateral: Secondary | ICD-10-CM

## 2023-09-22 DIAGNOSIS — R0982 Postnasal drip: Secondary | ICD-10-CM | POA: Diagnosis not present

## 2023-09-23 DIAGNOSIS — H6983 Other specified disorders of Eustachian tube, bilateral: Secondary | ICD-10-CM | POA: Insufficient documentation

## 2023-09-23 NOTE — Progress Notes (Signed)
 Patient ID: Tabitha Baker, female   DOB: [birth year], 73 y.o.   MRN: [id number]  Follow-up: Chronic eczematous otitis externa, eustachian tube dysfunction, hearing loss, recurrent sinusitis, postnasal drainage  HPI: The patient is a 73 year old female who presents today for follow-up of her multiple medical issues, including chronic eczematous otitis externa, bilateral eustachian tube dysfunction, hearing loss, recurrent sinusitis, and postnasal drainage.  The patient was last seen in November 2024.  At that time, she was noted to have nasal mucosal congestion, bilateral inferior turbinate hypertrophy, and postnasal drainage.  He was noted to have eczematous changes within the ear canals and bilateral high-frequency sensorineural hearing loss.  The patient was treated with Elocon, Flonase, Atrovent, and hearing aids.  The patient returns today complaining of severe allergy symptoms during the spring season.  She has noted frequent nasal congestion, facial pain, and nasal drainage.  Her eczematous otitis externa has improved with the use of Elocon cream.  She has significant difficulty auto insufflating her middle ear spaces.  Currently she denies any otalgia, otorrhea, facial pain, fever, or visual change.  Exam: General: Communicates without difficulty, well nourished, no acute distress. Head: Normocephalic, no evidence injury, no tenderness, facial buttresses intact without stepoff. Face/sinus: No tenderness to palpation and percussion. Facial movement is normal and symmetric. Eyes: PERRL, EOMI. No scleral icterus, conjunctivae clear. Neuro: CN II exam reveals vision grossly intact.  No nystagmus at any point of gaze. Ears: Auricles well formed without lesions.  Ear canals are intact without mass or lesion. Eczematous changes are noted bilaterally.The patient's tympanic membranes are intact bilaterally.  Both are mildly retracted.  No middle ear effusion or acute infection is noted. Nose: External evaluation  reveals normal support and skin without lesions.  Dorsum is intact.  Anterior rhinoscopy reveals congested mucosa over anterior aspect of inferior turbinates and intact septum.  No purulence noted. Oral:  Oral cavity and oropharynx are intact, symmetric, without erythema or edema.  Mucosa is moist without lesions. Neck: Full range of motion without pain.  There is no significant lymphadenopathy.  No masses palpable.  Thyroid  bed within normal limits to palpation.  Parotid glands and submandibular glands equal bilaterally without mass.  Trachea is midline. Neuro:  CN 2-12 grossly intact. Gait normal.    Procedure:  Flexible Nasal Endoscopy: Description: Risks, benefits, and alternatives of flexible endoscopy were explained to the patient.  Specific mention was made of the risk of throat numbness with difficulty swallowing, possible bleeding from the nose and mouth, and pain from the procedure.  The patient gave oral consent to proceed.  The flexible scope was inserted into the right nasal cavity.  Endoscopy of the interior nasal cavity, superior, inferior, and middle meatus was performed. The sphenoid-ethmoid recess was examined. Edematous mucosa was noted. Sinus openings were widely patent. No polyp, mass, or lesion was appreciated. Olfactory cleft was clear.  Nasopharynx with postnasal drainage.  Turbinates were well healed.  The procedure was repeated on the contralateral side with similar findings.  The patient tolerated the procedure well.    Assessment: 1.  Chronic rhinitis with nasal mucosal congestion and bilateral inferior turbinate hypertrophy. 2.  Chronic postnasal drainage. 3.  Her sinus openings are patent secondary to her previous surgery.  No acute infection is noted today. 4.  Bilateral eustachian tube dysfunction, with bilateral retracted tympanic membranes. 5.  Bilateral high-frequency sensorineural hearing loss. 6.  Her chronic eczematous otitis externa has improved.  Plan: 1.  The  physical exam and nasal endoscopy  findings are reviewed with the patient. 2.  The patient is reassured that no acute infection is noted today. 3.  Continue with Flonase, Atrovent, and nasal saline irrigation. 4.  Valsalva exercise multiple times a day. 5.  Allergy referral. 6.  Continue the use of her hearing aids. 7.  The patient will return for reevaluation in 6 months.

## 2023-09-29 ENCOUNTER — Ambulatory Visit (INDEPENDENT_AMBULATORY_CARE_PROVIDER_SITE_OTHER): Payer: Medicare Other | Admitting: Otolaryngology

## 2023-11-17 ENCOUNTER — Ambulatory Visit: Admission: EM | Admit: 2023-11-17 | Discharge: 2023-11-17 | Disposition: A | Discharge status: Home / Self Care

## 2023-11-17 VITALS — BP 131/76 | HR 82 | Temp 98.2°F | Resp 18 | Ht <= 58 in | Wt 104.9 lb

## 2023-11-17 DIAGNOSIS — W19XXXA Unspecified fall, initial encounter: Secondary | ICD-10-CM | POA: Diagnosis not present

## 2023-11-17 DIAGNOSIS — M791 Myalgia, unspecified site: Secondary | ICD-10-CM | POA: Diagnosis not present

## 2023-11-17 MED ORDER — KETOROLAC TROMETHAMINE 15 MG/ML IJ SOLN
15.0000 mg | Freq: Once | INTRAMUSCULAR | Status: AC
  Administered 2023-11-17: 15 mg via INTRAMUSCULAR

## 2023-11-17 MED ORDER — CYCLOBENZAPRINE HCL 5 MG PO TABS: 5.0000 mg | ORAL_TABLET | Freq: Two times a day (BID) | ORAL | 0 refills | Status: AC | PRN

## 2023-11-17 MED ORDER — KETOROLAC TROMETHAMINE 30 MG/ML IJ SOLN: 15.0000 mg | Freq: Once | INTRAMUSCULAR | Status: DC

## 2023-11-17 NOTE — ED Provider Notes (Signed)
 [name]    CSN: [id number] Arrival date & time: 11/17/23  0901      History   Chief Complaint Chief Complaint  Patient presents with   Fall    HPI Tabitha Baker is a 74 y.o. female, patient was at [facility] on yesterday and reports an tripping over a wet mount that was kidded at the door of the [facility].  She reports that the [facility] was balled up and she recalls falling down landing on her left outstretched hand however twisting her body and upon awakening today has generalized muscular pain.  She sustained an abrasion on her left elbow, right hand and right knee.  She reports that her glasses will off however they were not broken.  Not taking any medication today as she came directly here to urgent care for evaluation.  She continues to have full range of motion however inquires as to whether or not she needs an MRI or CT scan.   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   Cancer 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   Hardening of the aorta (main artery of the heart) (HCC) 09/01/2020   Hearing loss    History of squamous cell carcinoma    Hyperlipidemia    Hypothyroidism    Lymphoma in remission 02/21/2022   pt with hx of non hodgkins lymphoma needs f/u with dr izella   Mild aortic regurgitation    Mild tricuspid regurgitation    Mixed dyslipidemia 02/17/2020   Non-neoplastic nevus 02/21/2022   Osteoporosis    Pulmonic valve regurgitation 09/01/2020   Sinusitis 02/21/2022   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    UTI (urinary tract infection)      Patient Active Problem List   Diagnosis Date Noted   Other specified disorders of eustachian tube, bilateral 09/23/2023   Benign neoplasm of septum of nose 08/25/2023   Encounter for breast self examination education 08/25/2023   Encounter for cervical Pap smear with pelvic exam 08/25/2023   Hypertrophy of nasal turbinates 03/31/2023   Chronic rhinitis 03/31/2023   Postnasal drip 03/31/2023   Sensorineural hearing loss, bilateral 03/31/2023   Chronic eczematous otitis externa of both ears 03/31/2023   Benign neoplasm of nasal cavity, middle ear, 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/21/2022   Depression 02/21/2022   Dry eye syndrome 02/21/2022   Lymphoma in remission 02/21/2022   Non-neoplastic nevus 02/21/2022   Sinusitis 02/21/2022   Age-related osteoporosis without current pathological fracture 03/18/2021   Aortic valve disorder 09/01/2020   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   Hardening of the aorta (main artery of the heart) (HCC) 09/01/2020   Pulmonic valve regurgitation 09/01/2020   DOE (dyspnea on exertion) 02/17/2020   Mixed dyslipidemia 02/17/2020   Allergy    B-cell lymphoma (HCC)    Cancer (HCC)    Cataract    Hearing loss    History of squamous cell carcinoma    Hyperlipidemia    Drug-induced hypothyroidism    Mild aortic regurgitation    Mild tricuspid regurgitation  Osteoporosis    Hypothyroidism    Turner syndrome with mosaicism    UTI (urinary tract infection)     Past Surgical History:  Procedure Laterality Date   ABDOMINAL HYSTERECTOMY     APPENDECTOMY     EYE SURGERY     HERNIA REPAIR      OB History   No obstetric history on file.      Home Medications    Prior to Admission medications   Medication Sig Start Date End Date Taking? Authorizing Provider  cyclobenzaprine (FLEXERIL) 5 MG tablet Take 1 tablet (5 mg total) by mouth 3 times/day as needed-between meals & bedtime (muscular pain). 11/17/23  Yes Arloa Suzen RAMAN, NP  ipratropium (ATROVENT) 0.06 % nasal spray Place into both nostrils. 11/16/23  Yes [provider]  alendronate (FOSAMAX) 35 MG tablet Take 35 mg by mouth every 7 (seven) days. Take with a full glass of water on an empty stomach.    [provider]  amoxicillin -clavulanate (AUGMENTIN ) 875-125 MG tablet Take 1 tablet by mouth every 12 (twelve) hours. Patient not taking: Reported on 09/22/2023 08/25/23   Arloa Suzen RAMAN, NP  ARIPiprazole (ABILIFY) 2 MG tablet Take 2-4 mg by mouth every morning. 02/22/22   [provider]  atomoxetine (STRATTERA) 25 MG capsule Take 25 mg by mouth daily. 06/27/22   [provider]  buPROPion (WELLBUTRIN XL) 150 MG 24 hr tablet Take 150 mg by mouth daily. 08/08/22   [provider]  Calcium 600-200 MG-UNIT tablet Take 1 tablet by mouth daily.    [provider]  cholecalciferol (VITAMIN D3) 25 MCG (1000 UNIT) tablet Take 1,000 Units by mouth daily.    [provider]  Coenzyme Q10 (CO Q10) 200 MG CAPS Take 200 mg by mouth daily.    [provider]  conjugated estrogens (PREMARIN) vaginal cream Place 1 Applicatorful vaginally once a week.    [provider]  Cranberry-Vitamin C-Vitamin E (CRANBERRY PLUS VITAMIN C) 4200-20-3 MG-MG-UNIT CAPS Take 8,400 mg by mouth in the morning.    [provider]  cycloSPORINE (RESTASIS) 0.05 % ophthalmic emulsion Place 1 drop into both eyes 2 (two) times daily.    [provider]  ezetimibe (ZETIA) 10 MG tablet Take 10 mg by mouth daily.    [provider]  famotidine (PEPCID) 20 MG tablet Take 20 mg by mouth at bedtime. 02/15/21   [provider]  fexofenadine-pseudoephedrine (ALLEGRA-D 24) 180-240 MG 24 hr tablet Take 1 tablet by mouth daily.    [provider]  FLUoxetine (PROZAC) 40 MG capsule Take 40 mg by mouth daily. 02/15/22   [provider]  fluticasone (FLONASE) 50 MCG/ACT nasal  spray Place 1 spray into both nostrils 2 (two) times daily.     [provider]  lamoTRIgine (LAMICTAL XR) 50 MG 24 hour tablet Take 50 mg by mouth daily. 06/06/22   [provider]  levothyroxine (SYNTHROID) 75 MCG tablet Take 75 mcg by mouth daily before breakfast.    [provider]  Lutein 20 MG CAPS Take 20 mg by mouth daily.    [provider]  mometasone (ELOCON) 0.1 % cream Apply 1 Application topically daily as needed. 03/31/23   [provider]  Omega-3 Fatty Acids (FISH OIL) 1000 MG CAPS Take 1,000 mg by mouth daily.    [provider]  Red Yeast Rice 600 MG CAPS Take 1,200 mg by mouth 2 (two) times daily.  [provider]  rosuvastatin (CRESTOR) 5 MG tablet Take 5 mg by mouth 3 (three) times a week. 01/13/22   [provider]  Turmeric 500 MG CAPS Take 1,000 mg by mouth daily.     [provider]  viloxazine ER (QELBREE) 200 MG 24 hr capsule Take 200 mg by mouth daily. 09/27/22   [provider]    Family History Family History  Problem Relation Age of Onset   Cancer Mother    Hyperlipidemia Mother    Hypertension Mother    Heart disease Father    Stroke Father    Cancer Brother    Heart disease Brother    Hypertension Maternal Grandmother    Stroke Maternal Grandmother    Cancer Maternal Grandfather    Heart disease Maternal Grandfather    Stroke Paternal Grandmother    Cancer Paternal Grandfather     Social History Social History   Tobacco Use   Smoking status: Never   Smokeless tobacco: Never  Vaping Use   Vaping status: Never Used  Substance Use Topics   Alcohol use: No    Alcohol/week: 0.0 standard drinks of alcohol   Drug use: No     Allergies   Codeine, Erythromycin, Fentanyl, Pegfilgrastim, Sulfa antibiotics, and Tape   Review of Systems Review of Systems  Constitutional: Negative.   HENT: Negative.    Eyes: Negative.   Respiratory: Negative.     Cardiovascular: Negative.   Musculoskeletal:  Positive for back pain and myalgias. Negative for arthralgias.  Neurological:  Negative for dizziness.    Physical Exam Triage Vital Signs ED Triage Vitals  Encounter Vitals Group     BP 11/17/23 0919 131/76     Girls Systolic BP Percentile --      Girls Diastolic BP Percentile --      Boys Systolic BP Percentile --      Boys Diastolic BP Percentile --      Pulse Rate 11/17/23 0919 82     Resp 11/17/23 0919 18     Temp 11/17/23 0919 98.2 F (36.8 C)     Temp Source 11/17/23 0919 Oral     SpO2 11/17/23 0919 97 %     Weight 11/17/23 0916 104 lb 15 oz (47.6 kg)     Height 11/17/23 0916 4' 10 (1.473 m)     Head Circumference --      Peak Flow --      Pain Score 11/17/23 0910 8     Pain Loc --      Pain Education --      Exclude from Growth Chart --    No data found.  Updated Vital Signs BP 131/76 (BP Location: Left Arm)   Pulse 82   Temp 98.2 F (36.8 C) (Oral)   Resp 18   Ht 4' 10 (1.473 m)   Wt 104 lb 15 oz (47.6 kg)   SpO2 97%   BMI 21.93 kg/m   Visual Acuity Right Eye Distance:   Left Eye Distance:   Bilateral Distance:    Right Eye Near:   Left Eye Near:    Bilateral Near:     Physical Exam Constitutional:      General: She is not in acute distress.    Appearance: Normal appearance. She is not ill-appearing.  HENT:     Head: Normocephalic and atraumatic.     Nose: Nose normal.  Eyes:     Extraocular Movements: Extraocular movements intact.  Conjunctiva/sclera: Conjunctivae normal.     Pupils: Pupils are equal, round, and reactive to light.  Cardiovascular:     Rate and Rhythm: Normal rate and regular rhythm.  Pulmonary:     Effort: Pulmonary effort is normal.     Breath sounds: Normal breath sounds.  Musculoskeletal:     Cervical back: Normal range of motion and neck supple.  Skin:    General: Skin is warm and dry.  Neurological:     General: No focal deficit present.     Mental Status: She  is alert and oriented to person, place, and time.      UC Treatments / Results  Labs (all labs ordered are listed, but only abnormal results are displayed) Labs Reviewed - No data to display  EKG   Radiology No results found.  Procedures Procedures (including critical care time)  Medications Ordered in UC Medications  ketorolac (TORADOL) 15 MG/ML injection 15 mg (15 mg Intramuscular Given 11/17/23 1003)    Initial Impression / Assessment and Plan / UC Course  I have reviewed the triage vital signs and the nursing notes.  Pertinent labs & imaging results that were available during my care of the patient were reviewed by me and considered in my medical decision making (see chart for details).    Muscular pain secondary to recent fall, Toradol 50 mg IM given here in clinic.  Prescribed Flexeril at bedtime as needed for muscular pain and cautioned that medication causes severe drowsiness.  Patient to follow up with PCP if symptoms have not improved within 4 days if at any point symptoms become severe go to the emergency department.  Patient verbalized understanding and agreement with plan.  Final diagnoses:  Muscular pain  Fall, initial encounter     Discharge Instructions      Symptoms have not resolved within 4 days follow-up with PCP or if at any point symptoms worsen go to the emergency room.     ED Prescriptions     Medication Sig Dispense Auth. Provider   cyclobenzaprine (FLEXERIL) 5 MG tablet Take 1 tablet (5 mg total) by mouth 3 times/day as needed-between meals & bedtime (muscular pain). 10 tablet Arloa Suzen RAMAN, NP      PDMP not reviewed this encounter.   Arloa Suzen RAMAN, NP 11/17/23 1052

## 2023-11-17 NOTE — Discharge Instructions (Signed)
 Symptoms have not resolved within 4 days follow-up with PCP or if at any point symptoms worsen go to the emergency room.

## 2023-11-17 NOTE — ED Triage Notes (Signed)
 I fell, between 230-3pm in the [employer], When going into the 2nd door, I fell on their rain mat that had a bump in it, falling on my front side, my left elbow has an abrasion and now aching all over. It didn't knock me out, just stunned. My glasses did fly off, not broken.

## 2024-01-16 ENCOUNTER — Telehealth (INDEPENDENT_AMBULATORY_CARE_PROVIDER_SITE_OTHER): Payer: Self-pay | Admitting: Otolaryngology

## 2024-01-16 NOTE — Telephone Encounter (Signed)
 Atrovent 0.06% nasal spray was called in at [facility] [phone]) with total of 6 refills.

## 2024-01-23 ENCOUNTER — Ambulatory Visit
Admission: RE | Admit: 2024-01-23 | Discharge: 2024-01-23 | Disposition: A | Discharge status: Home / Self Care | Source: Ambulatory Visit | Attending: Physician Assistant | Admitting: Physician Assistant

## 2024-01-23 ENCOUNTER — Other Ambulatory Visit: Payer: Self-pay | Admitting: Physician Assistant

## 2024-01-23 DIAGNOSIS — M25532 Pain in left wrist: Secondary | ICD-10-CM

## 2024-02-01 ENCOUNTER — Encounter: Payer: Self-pay | Admitting: Physician Assistant

## 2024-02-01 ENCOUNTER — Other Ambulatory Visit: Payer: Self-pay | Admitting: Physician Assistant

## 2024-02-01 DIAGNOSIS — Z0000 Encounter for general adult medical examination without abnormal findings: Secondary | ICD-10-CM

## 2024-02-04 ENCOUNTER — Other Ambulatory Visit (HOSPITAL_BASED_OUTPATIENT_CLINIC_OR_DEPARTMENT_OTHER): Payer: Self-pay | Admitting: Physician Assistant

## 2024-02-04 DIAGNOSIS — M810 Age-related osteoporosis without current pathological fracture: Secondary | ICD-10-CM

## 2024-02-08 ENCOUNTER — Ambulatory Visit
Admission: RE | Admit: 2024-02-08 | Discharge: 2024-02-08 | Disposition: A | Discharge status: Home / Self Care | Source: Ambulatory Visit | Attending: Physician Assistant | Admitting: Physician Assistant

## 2024-02-08 DIAGNOSIS — Z0000 Encounter for general adult medical examination without abnormal findings: Secondary | ICD-10-CM

## 2024-02-13 ENCOUNTER — Ambulatory Visit: Admitting: Orthopedic Surgery

## 2024-02-21 ENCOUNTER — Ambulatory Visit: Admitting: Orthopedic Surgery

## 2024-02-21 DIAGNOSIS — M65342 Trigger finger, left ring finger: Secondary | ICD-10-CM

## 2024-02-21 DIAGNOSIS — M65341 Trigger finger, right ring finger: Secondary | ICD-10-CM

## 2024-02-21 DIAGNOSIS — M19132 Post-traumatic osteoarthritis, left wrist: Secondary | ICD-10-CM | POA: Diagnosis not present

## 2024-02-21 MED ORDER — LIDOCAINE HCL 1 % IJ SOLN
1.0000 mL | INTRAMUSCULAR | Status: AC | PRN
  Administered 2024-02-21: 1 mL

## 2024-02-21 MED ORDER — BETAMETHASONE SOD PHOS & ACET 6 (3-3) MG/ML IJ SUSP
6.0000 mg | INTRAMUSCULAR | Status: AC | PRN
  Administered 2024-02-21: 6 mg via INTRA_ARTICULAR

## 2024-02-21 NOTE — Progress Notes (Signed)
 JAREN KEARN - 73 y.o. female MRN [id number]  Date of birth: Jul 26, 1950  Office Visit Note: Visit Date: 02/21/2024 PCP: Alys Schuyler HERO, PA Referred by: Stacia Millman, PA  Subjective: No chief complaint on file.  HPI: HANALEI GLACE is a pleasant 73 y.o. female who presents today for evaluation of ongoing bilateral wrist pain with associated clicking and catching of the bilateral ring fingers.  Left wrist pain is more severe.  She states that this is chronic in nature, was exacerbated by a recent fall approximately 3 months prior.  As far as the bilateral ring trigger digits, she is unsure of the chronicity of this problem, has not undergone prior workup or treatment.  Pertinent ROS were reviewed with the patient and found to be negative unless otherwise specified above in HPI.   Visit Reason: Bilateral wrist and hand pain L>R, bilateral ring finger trigger digit Bilateral ring triggers Duration of symptoms:3 months Hand dominance: right Occupation: retired Diabetic: No Smoking: No Heart/Lung History: Cardiovascular and Mediastinum Mild aortic regurgitation Mild tricuspid regurgitation Aortic valve disorder  Blood Thinners:  none  Prior Testing/EMG: x-rays Injections (Date): none Treatments: brace, and hydrocodone Prior Surgery: none  Assessment & Plan: Visit Diagnoses:  1. Trigger finger, left ring finger   2. Trigger finger, right ring finger   3. Slac (scapholunate advanced collapse) of wrist, left     Plan: Extensive discussion was had with the patient today regarding her bilateral ring trigger digit.  We discussed the etiology and pathophysiology of stenosing tenosynovitis.  We discussed conservative versus surgical treatment modalities.  From a conservative standpoint, we discussed activity modification, splinting, therapy and injections.  From a surgical standpoint, we discussed the possibility for trigger digit release as well as all risk and benefits  associated.  Given that she has not trialed conservative treatments, patient is appropriate candidate for cortisone injection to the bilateral ring finger A1 pulley for symptom relief.  Risks and benefit of the cortisone injection were discussed in detail, patient agreed to proceed.  Injection was provided today without issue, patient will return in approximate 6 weeks time for a recheck.  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  We discussed conservative versus treatment modalities.  She can continue bracing for the time being given that this is giving her some moderate relief.  I did explain to her that in the future, we could also trial cortisone injections at the wrist level.  From a surgical standpoint, I explained that she would be an appropriate candidate for proximal row carpectomy in the future should symptoms become refractory to conservative care.  I spent 45 minutes in the care of this patient today including review of previous documentation, imaging obtained, face-to-face time discussing all options regarding treatment and documenting the encounter.   Follow-up: No follow-ups on file.   Meds & Orders: No orders of the defined types were placed in this encounter.   Orders Placed This Encounter  Procedures   Hand/UE Inj     Procedures: Hand/UE Inj: bilateral ring A1 for trigger finger on 02/21/2024 6:25 PM Indications: pain Details: 25 G needle, volar approach Medications (Right): 1 mL lidocaine 1 %; 6 mg betamethasone acetate-betamethasone sodium phosphate 6 (3-3) MG/ML Medications (Left): 1 mL lidocaine 1 %; 6 mg betamethasone acetate-betamethasone sodium phosphate 6 (3-3) MG/ML Outcome: tolerated well, no immediate complications Procedure, treatment alternatives, risks and benefits explained, specific  risks discussed. Consent was given by the patient. Patient was prepped and draped in  the usual sterile fashion.          Clinical History: No specialty comments available.  She reports that she has never smoked. She has never used smokeless tobacco. No results for input(s): HGBA1C, LABURIC in the last 8760 hours.  Objective:   Vital Signs: There were no vitals taken for this visit.  Physical Exam  Gen: Well-appearing, in no acute distress; non-toxic CV: Regular Rate. Well-perfused. Warm.  Resp: Breathing unlabored on room air; no wheezing. Psych: Fluid speech in conversation; appropriate affect; normal thought process  Ortho Exam PHYSICAL EXAM:  General: Patient is well appearing and in no distress.  Skin and Muscle: No significant skin changes are apparent to hands.    Range of Motion and Palpation Tests: Mobility is full about the elbows with flexion and extension.  Forearm supination and pronation are 65/55 bilaterally.  Wrist flexion/extension is 55/45 bilaterally, pain elicited at end ranges of motion left wrist.  Digital flexion and extension are full.  Thumb opposition is full to the base of the small fingers bilaterally.    Palpable nodules at the bilateral ring finger A1 pulley regions with associated tenderness.  Notable triggering is observed bilateral ring fingers.     Neurologic, Vascular, Motor: Sensation is intact to light touch in the median/radial/ulnar distributions.   Fingers pink and well perfused.  Capillary refill is brisk.     No results found for: HGBA1C   Imaging: No results found. Prior left wrist x-rays were reviewed independently by myself today which are compatible with SLAC wrist arthritis  Past Medical/Family/Surgical/Social History: Medications & Allergies reviewed per EMR, new medications updated. Patient Active Problem List   Diagnosis Date Noted   Other specified disorders of eustachian tube, bilateral 09/23/2023   Benign neoplasm of septum of nose 08/25/2023   Encounter for breast self examination  education 08/25/2023   Encounter for cervical Pap smear with pelvic exam 08/25/2023   Hypertrophy of nasal turbinates 03/31/2023   Chronic rhinitis 03/31/2023   Postnasal drip 03/31/2023   Sensorineural hearing loss, bilateral 03/31/2023   Chronic eczematous otitis externa of both ears 03/31/2023   Benign neoplasm of nasal cavity, middle ear, 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/21/2022   Depression 02/21/2022   Dry eye syndrome 02/21/2022   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) 02/21/2022   Non-neoplastic nevus 02/21/2022   Sinusitis 02/21/2022   Age-related osteoporosis without current pathological fracture 03/18/2021   Aortic valve disorder 09/01/2020   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   Hardening of the aorta (main artery of the heart) 09/01/2020   Pulmonic valve regurgitation 09/01/2020   DOE (dyspnea on exertion) 02/17/2020   Mixed dyslipidemia 02/17/2020   Allergy    B-cell lymphoma (HCC)    Cancer (HCC)    Cataract    Hearing loss    History of squamous cell carcinoma    Hyperlipidemia    Drug-induced hypothyroidism    Mild aortic regurgitation    Mild tricuspid regurgitation    Osteoporosis    Hypothyroidism    Turner syndrome with mosaicism    UTI (urinary tract infection)    Past Medical History:  Diagnosis Date   Age-related osteoporosis without current pathological fracture 03/18/2021   Allergy    Aortic valve disorder 09/01/2020   B-cell lymphoma (HCC)    Benign neoplasm of nasal cavity, middle ear, 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/21/2022   Her nose appears irritated.  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) 02/21/2022   pt  with hx of non hodgkins lymphoma needs f/u with dr izella   Mild aortic regurgitation    Mild tricuspid regurgitation    Mixed dyslipidemia 02/17/2020   Non-neoplastic nevus 02/21/2022   Osteoporosis    Pulmonic valve regurgitation 09/01/2020   Sinusitis 02/21/2022   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    UTI (urinary tract infection)    Family History  Problem Relation Age of Onset   Cancer Mother    Hyperlipidemia Mother    Hypertension Mother    Heart disease Father    Stroke Father    Cancer Brother    Heart disease Brother    Hypertension Maternal Grandmother    Stroke Maternal Grandmother    Cancer Maternal Grandfather    Heart disease Maternal Grandfather    Stroke Paternal Grandmother    Cancer Paternal Grandfather    Past Surgical History:  Procedure Laterality Date   ABDOMINAL HYSTERECTOMY     APPENDECTOMY     EYE SURGERY     HERNIA REPAIR     Social History   Occupational History   Not on file  Tobacco Use   Smoking status: Never   Smokeless tobacco: Never  Vaping Use   Vaping status: Never Used  Substance and Sexual Activity   Alcohol use: No    Alcohol/week: 0.0 standard drinks of alcohol   Drug use: No   Sexual activity: Not Currently    Anzley Dibbern Estela) Arlinda, M.D. [facility], Hand Surgery

## 2024-03-04 ENCOUNTER — Encounter: Payer: Self-pay | Admitting: Radiology

## 2024-03-25 ENCOUNTER — Ambulatory Visit (INDEPENDENT_AMBULATORY_CARE_PROVIDER_SITE_OTHER): Admitting: Otolaryngology

## 2024-03-25 ENCOUNTER — Encounter (INDEPENDENT_AMBULATORY_CARE_PROVIDER_SITE_OTHER): Payer: Self-pay | Admitting: Otolaryngology

## 2024-03-25 VITALS — BP 103/60 | HR 69 | Temp 98.0°F | Ht <= 58 in | Wt 106.0 lb

## 2024-03-25 DIAGNOSIS — H6983 Other specified disorders of Eustachian tube, bilateral: Secondary | ICD-10-CM

## 2024-03-25 DIAGNOSIS — J343 Hypertrophy of nasal turbinates: Secondary | ICD-10-CM | POA: Diagnosis not present

## 2024-03-25 DIAGNOSIS — H903 Sensorineural hearing loss, bilateral: Secondary | ICD-10-CM

## 2024-03-25 DIAGNOSIS — J310 Chronic rhinitis: Secondary | ICD-10-CM | POA: Diagnosis not present

## 2024-03-25 MED ORDER — IPRATROPIUM BROMIDE 0.06 % NA SOLN: 2.0000 | Freq: Two times a day (BID) | NASAL | 12 refills | Status: AC | PRN

## 2024-03-25 NOTE — Progress Notes (Signed)
 Patient ID: Tabitha Baker, female   DOB: 03-19-1951, 73 y.o.   MRN: [id number]  Follow-up: Chronic nasal congestion, recurrent sinusitis, postnasal drainage, eustachian tube dysfunction  HPI: The patient is a 73 year old female who returns today for her follow-up evaluation.  The patient was previously seen for multiple medical issues, including chronic eczematous otitis externa, bilateral eustachian tube dysfunction, hearing loss, recurrent sinusitis, and postnasal drainage.  She was treated with Elocon, Flonase, Atrovent , and hearing aids.  The patient returns today complaining of increasing nasal and sinus congestion.  She is also having frequent postnasal drainage.  Otherwise she has been doing well.  Currently she denies any otalgia, otorrhea, facial pain, or fever.  Exam: General: Communicates without difficulty, well nourished, no acute distress. Head: Normocephalic, no evidence injury, no tenderness, facial buttresses intact without stepoff. Face/sinus: No tenderness to palpation and percussion. Facial movement is normal and symmetric. Eyes: PERRL, EOMI. No scleral icterus, conjunctivae clear. Neuro: CN II exam reveals vision grossly intact.  No nystagmus at any point of gaze. Ears: Auricles well formed without lesions.  Ear canals are intact without mass or lesion.  No erythema or edema is appreciated.  The TMs are intact without fluid. Nose: External evaluation reveals normal support and skin without lesions.  Dorsum is intact.  Anterior rhinoscopy reveals congested mucosa over anterior aspect of inferior turbinates and intact septum.  No purulence noted. Oral:  Oral cavity and oropharynx are intact, symmetric, without erythema or edema.  Mucosa is moist without lesions. Neck: Full range of motion without pain.  There is no significant lymphadenopathy.  No masses palpable.  Thyroid  bed within normal limits to palpation.  Parotid glands and submandibular glands equal bilaterally without mass.   Trachea is midline. Neuro:  CN 2-12 grossly intact.   Assessment: 1.  Chronic rhinitis with nasal mucosal congestion and bilateral inferior turbinate hypertrophy. 2.  Chronic nasal drainage. 3.  Chronic eustachian tube dysfunction.  Plan: 1.  The physical exam findings are reviewed with the patient. 2.  The patient is reassured and no acute infection is noted today. 3.  Continue with Flonase, Atrovent , nasal saline irrigation, and Valsalva exercise. 4.  Atrovent  refill. 5.  The patient will return for reevaluation in 6 months.

## 2024-04-08 ENCOUNTER — Telehealth: Payer: Self-pay | Admitting: Orthopedic Surgery

## 2024-04-08 ENCOUNTER — Ambulatory Visit: Admitting: Orthopedic Surgery

## 2024-04-08 NOTE — Telephone Encounter (Signed)
 Called and spoke with patient and got her rescheduled to next week

## 2024-04-08 NOTE — Telephone Encounter (Signed)
 Pt called and wants to get rescheduled for the injection in the ringer finger. CB#[phone]

## 2024-04-14 NOTE — Progress Notes (Unsigned)
 Tabitha Baker - 73 y.o. female MRN [id number]  Date of birth: 14-Nov-1950  Office Visit Note: Visit Date: 04/15/2024 PCP: Stacia Millman, PA Referred by: Alys Schuyler HERO, PA  Subjective: No chief complaint on file.  HPI: Tabitha Baker is a pleasant 73 y.o. female who returns today for follow-up of bilateral ring finger trigger digits.  Underwent injections earlier this year with notable relief of symptoms.  Does have some occasional clicking of the left hand, right hand has improved.   Pertinent ROS were reviewed with the patient and found to be negative unless otherwise specified above in HPI.    Assessment & Plan: Visit Diagnoses:  1. Trigger finger, left ring finger   2. Trigger finger, right ring finger      Plan: Extensive discussion was had with the patient today regarding her bilateral ring trigger digit.  We once again discussed the etiology and pathophysiology of stenosing tenosynovitis.  We discussed conservative versus surgical treatment modalities.  From a conservative standpoint, we discussed activity modification, splinting, therapy and injections.  From a surgical standpoint, we discussed the possibility for trigger digit release as well as all risk and benefits associated.  Given that she has responded fairly well to the previous injections, we can take a watch and wait approach for now.  I did explain that should the symptoms recur in the future she should return for further consideration of either repeat injections or potential bilateral, staged trigger digit releases.  She expressed understanding.   Follow-up: No follow-ups on file.   Meds & Orders: No orders of the defined types were placed in this encounter.   No orders of the defined types were placed in this encounter.    Procedures: No procedures performed      Clinical History: No specialty comments available.  She reports that she has never smoked. She has never used smokeless tobacco.  No results for input(s): HGBA1C, LABURIC in the last 8760 hours.  Objective:   Vital Signs: There were no vitals taken for this visit.  Physical Exam  Gen: Well-appearing, in no acute distress; non-toxic CV: Regular Rate. Well-perfused. Warm.  Resp: Breathing unlabored on room air; no wheezing. Psych: Fluid speech in conversation; appropriate affect; normal thought process  Ortho Exam PHYSICAL EXAM:  General: Patient is well appearing and in no distress.  Skin and Muscle: No significant skin changes are apparent to hands.    Range of Motion and Palpation Tests: Mobility is full about the elbows with flexion and extension.  Forearm supination and pronation are 65/55 bilaterally.  Wrist flexion/extension is 55/45 bilaterally, pain elicited at end ranges of motion left wrist.  Digital flexion and extension are full.  Thumb opposition is full to the base of the small fingers bilaterally.    Palpable nodules at the bilateral ring finger A1 pulley regions with associated tenderness.  Minimal triggering is observed bilateral ring fingers.     Neurologic, Vascular, Motor: Sensation is intact to light touch in the median/radial/ulnar distributions.   Fingers pink and well perfused.  Capillary refill is brisk.     No results found for: HGBA1C   Imaging: No results found. Prior left wrist x-rays were reviewed independently by myself today which are compatible with SLAC wrist arthritis  Past Medical/Family/Surgical/Social History: Medications & Allergies reviewed per EMR, new medications updated. Patient Active Problem List   Diagnosis Date Noted   Other specified disorders of eustachian tube, bilateral 09/23/2023   Benign neoplasm of septum  of nose 08/25/2023   Encounter for breast self examination education 08/25/2023   Encounter for cervical Pap smear with pelvic exam 08/25/2023   Hypertrophy of nasal turbinates 03/31/2023   Chronic rhinitis 03/31/2023   Postnasal drip  03/31/2023   Sensorineural hearing loss, bilateral 03/31/2023   Chronic eczematous otitis externa of both ears 03/31/2023   Benign neoplasm of nasal cavity, middle ear, 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/21/2022   Depression 02/21/2022   Dry eye syndrome 02/21/2022   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) 02/21/2022   Non-neoplastic nevus 02/21/2022   Sinusitis 02/21/2022   Age-related osteoporosis without current pathological fracture 03/18/2021   Aortic valve disorder 09/01/2020   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   Hardening of the aorta (main artery of the heart) 09/01/2020   Pulmonic valve regurgitation 09/01/2020   DOE (dyspnea on exertion) 02/17/2020   Mixed dyslipidemia 02/17/2020   Allergy    B-cell lymphoma (HCC)    Cancer (HCC)    Cataract    Hearing loss    History of squamous cell carcinoma    Hyperlipidemia    Drug-induced hypothyroidism    Mild aortic regurgitation    Mild tricuspid regurgitation    Osteoporosis    Hypothyroidism    Turner syndrome with mosaicism    UTI (urinary tract infection)    Past Medical History:  Diagnosis Date   Age-related osteoporosis without current pathological fracture 03/18/2021   Allergy    Aortic valve disorder 09/01/2020   B-cell lymphoma (HCC)    Benign neoplasm of nasal cavity, middle ear, 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/21/2022   Her nose appears irritated.   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) 02/21/2022   pt with hx of non hodgkins lymphoma needs f/u with dr izella   Mild aortic regurgitation    Mild tricuspid regurgitation    Mixed dyslipidemia 02/17/2020   Non-neoplastic nevus  02/21/2022   Osteoporosis    Pulmonic valve regurgitation 09/01/2020   Sinusitis 02/21/2022   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    UTI (urinary tract infection)    Family History  Problem Relation Age of Onset   Cancer Mother    Hyperlipidemia Mother    Hypertension Mother    Heart disease Father    Stroke Father    Cancer Brother    Heart disease Brother    Hypertension Maternal Grandmother    Stroke Maternal Grandmother    Cancer Maternal Grandfather    Heart disease Maternal Grandfather    Stroke Paternal Grandmother    Cancer Paternal Grandfather    Past Surgical History:  Procedure Laterality Date   ABDOMINAL HYSTERECTOMY     APPENDECTOMY     EYE SURGERY     HERNIA REPAIR     Social History   Occupational History   Not on file  Tobacco Use   Smoking status: Never   Smokeless tobacco: Never  Vaping Use   Vaping status: Never Used  Substance and Sexual Activity   Alcohol use: No    Alcohol/week: 0.0 standard drinks of alcohol   Drug  use: No   Sexual activity: Not Currently    Abraham Entwistle Estela) Arlinda, M.D. [facility], Hand Surgery

## 2024-04-15 ENCOUNTER — Ambulatory Visit: Admitting: Orthopedic Surgery

## 2024-04-15 DIAGNOSIS — M65342 Trigger finger, left ring finger: Secondary | ICD-10-CM

## 2024-04-15 DIAGNOSIS — M65341 Trigger finger, right ring finger: Secondary | ICD-10-CM

## 2024-05-27 ENCOUNTER — Ambulatory Visit: Admitting: Orthopedic Surgery

## 2024-06-03 ENCOUNTER — Ambulatory Visit: Admitting: Orthopedic Surgery

## 2024-06-10 ENCOUNTER — Ambulatory Visit: Admitting: Orthopedic Surgery

## 2024-09-30 ENCOUNTER — Ambulatory Visit (INDEPENDENT_AMBULATORY_CARE_PROVIDER_SITE_OTHER): Admitting: Otolaryngology
